# Patient Record
Sex: Female | Born: 1935 | Race: White | Hispanic: No | Marital: Married | Smoking: Never smoker
Health system: Southern US, Community
[De-identification: ages and names within clinical notes are randomized; demographics above are authoritative.]

## PROBLEM LIST (undated history)

## (undated) DIAGNOSIS — I639 Cerebral infarction, unspecified: Secondary | ICD-10-CM

## (undated) DIAGNOSIS — I2510 Atherosclerotic heart disease of native coronary artery without angina pectoris: Secondary | ICD-10-CM

## (undated) DIAGNOSIS — M1990 Unspecified osteoarthritis, unspecified site: Secondary | ICD-10-CM

## (undated) DIAGNOSIS — I350 Nonrheumatic aortic (valve) stenosis: Secondary | ICD-10-CM

## (undated) DIAGNOSIS — I739 Peripheral vascular disease, unspecified: Secondary | ICD-10-CM

## (undated) DIAGNOSIS — E785 Hyperlipidemia, unspecified: Secondary | ICD-10-CM

## (undated) DIAGNOSIS — I779 Disorder of arteries and arterioles, unspecified: Secondary | ICD-10-CM

## (undated) DIAGNOSIS — I10 Essential (primary) hypertension: Secondary | ICD-10-CM

## (undated) HISTORY — DX: Disorder of arteries and arterioles, unspecified: I77.9

## (undated) HISTORY — DX: Unspecified osteoarthritis, unspecified site: M19.90

## (undated) HISTORY — DX: Peripheral vascular disease, unspecified: I73.9

## (undated) HISTORY — DX: Cerebral infarction, unspecified: I63.9

## (undated) HISTORY — DX: Hyperlipidemia, unspecified: E78.5

## (undated) HISTORY — DX: Nonrheumatic aortic (valve) stenosis: I35.0

## (undated) HISTORY — DX: Atherosclerotic heart disease of native coronary artery without angina pectoris: I25.10

## (undated) HISTORY — DX: Essential (primary) hypertension: I10

---

## 1972-05-14 HISTORY — PX: ABDOMINAL HYSTERECTOMY: SHX81

## 2001-03-14 ENCOUNTER — Encounter: Payer: Self-pay | Admitting: Internal Medicine

## 2001-03-14 ENCOUNTER — Ambulatory Visit (HOSPITAL_COMMUNITY): Admission: RE | Admit: 2001-03-14 | Discharge: 2001-03-14 | Payer: Self-pay | Admitting: Internal Medicine

## 2001-12-06 ENCOUNTER — Inpatient Hospital Stay (HOSPITAL_COMMUNITY): Admission: EM | Admit: 2001-12-06 | Discharge: 2001-12-10 | Payer: Self-pay | Admitting: Psychiatry

## 2001-12-06 ENCOUNTER — Encounter: Payer: Self-pay | Admitting: Emergency Medicine

## 2006-05-20 ENCOUNTER — Ambulatory Visit (HOSPITAL_COMMUNITY): Admission: RE | Admit: 2006-05-20 | Discharge: 2006-05-20 | Payer: Self-pay | Admitting: Internal Medicine

## 2006-07-05 ENCOUNTER — Ambulatory Visit: Payer: Self-pay | Admitting: Vascular Surgery

## 2006-12-25 ENCOUNTER — Ambulatory Visit: Payer: Self-pay | Admitting: Vascular Surgery

## 2007-03-04 ENCOUNTER — Ambulatory Visit (HOSPITAL_COMMUNITY): Admission: RE | Admit: 2007-03-04 | Discharge: 2007-03-04 | Payer: Self-pay | Admitting: Internal Medicine

## 2008-10-04 ENCOUNTER — Encounter: Payer: Self-pay | Admitting: Cardiology

## 2008-10-05 ENCOUNTER — Inpatient Hospital Stay (HOSPITAL_COMMUNITY): Admission: RE | Admit: 2008-10-05 | Discharge: 2008-10-19 | Payer: Self-pay | Admitting: Cardiology

## 2008-10-05 ENCOUNTER — Ambulatory Visit: Payer: Self-pay | Admitting: Pulmonary Disease

## 2008-10-05 ENCOUNTER — Ambulatory Visit: Payer: Self-pay | Admitting: Cardiothoracic Surgery

## 2008-10-06 ENCOUNTER — Encounter (INDEPENDENT_AMBULATORY_CARE_PROVIDER_SITE_OTHER): Payer: Self-pay | Admitting: Cardiology

## 2008-10-06 ENCOUNTER — Encounter: Payer: Self-pay | Admitting: Cardiothoracic Surgery

## 2008-10-07 HISTORY — PX: CORONARY ARTERY BYPASS GRAFT: SHX141

## 2008-10-10 ENCOUNTER — Encounter (INDEPENDENT_AMBULATORY_CARE_PROVIDER_SITE_OTHER): Payer: Self-pay | Admitting: Cardiology

## 2008-11-12 ENCOUNTER — Encounter: Admission: RE | Admit: 2008-11-12 | Discharge: 2008-11-12 | Payer: Self-pay | Admitting: Cardiothoracic Surgery

## 2008-11-12 ENCOUNTER — Ambulatory Visit: Payer: Self-pay | Admitting: Cardiothoracic Surgery

## 2008-12-17 ENCOUNTER — Ambulatory Visit (HOSPITAL_COMMUNITY): Admission: RE | Admit: 2008-12-17 | Discharge: 2008-12-17 | Payer: Self-pay | Admitting: Cardiovascular Disease

## 2009-02-04 ENCOUNTER — Emergency Department (HOSPITAL_COMMUNITY): Admission: EM | Admit: 2009-02-04 | Discharge: 2009-02-04 | Payer: Self-pay | Admitting: Emergency Medicine

## 2009-02-08 ENCOUNTER — Ambulatory Visit (HOSPITAL_COMMUNITY): Admission: RE | Admit: 2009-02-08 | Discharge: 2009-02-08 | Payer: Self-pay | Admitting: Internal Medicine

## 2009-02-09 ENCOUNTER — Ambulatory Visit: Payer: Self-pay | Admitting: Vascular Surgery

## 2009-02-25 ENCOUNTER — Ambulatory Visit (HOSPITAL_COMMUNITY): Admission: RE | Admit: 2009-02-25 | Discharge: 2009-02-25 | Payer: Self-pay | Admitting: Vascular Surgery

## 2009-02-25 ENCOUNTER — Ambulatory Visit: Payer: Self-pay | Admitting: Vascular Surgery

## 2009-03-30 ENCOUNTER — Ambulatory Visit: Payer: Self-pay | Admitting: Vascular Surgery

## 2009-04-12 ENCOUNTER — Ambulatory Visit (HOSPITAL_COMMUNITY): Admission: RE | Admit: 2009-04-12 | Discharge: 2009-04-12 | Payer: Self-pay | Admitting: Cardiovascular Disease

## 2010-06-15 ENCOUNTER — Other Ambulatory Visit (INDEPENDENT_AMBULATORY_CARE_PROVIDER_SITE_OTHER): Payer: Medicare Other

## 2010-06-15 ENCOUNTER — Ambulatory Visit (INDEPENDENT_AMBULATORY_CARE_PROVIDER_SITE_OTHER): Payer: Medicare Other | Admitting: Vascular Surgery

## 2010-06-15 ENCOUNTER — Ambulatory Visit: Admit: 2010-06-15 | Payer: Self-pay | Admitting: Vascular Surgery

## 2010-06-15 DIAGNOSIS — I6529 Occlusion and stenosis of unspecified carotid artery: Secondary | ICD-10-CM

## 2010-06-26 NOTE — Assessment & Plan Note (Signed)
OFFICE VISIT  [name], [name] DOB:  [birth year]                                       06/15/2010 ZOXWR#:[id number]  The patient returns for follow-up today.  She was last seen in November 2010. At that time we were evaluating her for a carotid stenosis.  She had had some events of numbness and tingling in her right hand which sounded like a left brain event but she had essentially no left carotid stenosis.  Since that time she denies any new symptoms of TIA, amaurosis or stroke.  She states that the right hand numbness has been persistent and is off and on.  She says also sometimes it takes her time to collect her thoughts but she does not really think she has had any decline in her memory.  REVIEW OF SYSTEMS:  She denies any shortness of breath or chest pain.  PHYSICAL EXAMINATION:  Blood pressure is 161/79 in the right arm and 127/71 in the left arm.  Oxygen saturation is 97% on room air.  Heart rate is 63 regular.  HEENT is unremarkable.  Neck has 2+ carotid pulses without bruit.  Chest is clear to auscultation.  Cardiac exam is regular rate and rhythm without murmur.  Neurologic exam shows symmetric upper extremity and lower extremity strength which is 5/5 bilaterally. Peripheral vascular exam shows 2+ radial pulses bilaterally.  She had a carotid duplex exam today which shows a 60% to 80% stenosis on the right internal carotid artery, which is unchanged from her last carotid duplex dated September 2010.  She had no significant left carotid stenosis.  The right vertebral artery was not visualized.  She has a dominant left vertebral artery.  In summary, the patient is asymptomatic from her carotid stenosis at this point.  She will continue to take her aspirin and Plavix, and her Plavix prescription was renewed today.  She will follow up with a repeat carotid duplex scan in 6 months.    Janetta Hora. Fields, MD Electronically Signed  CEF/MEDQ   D:  06/15/2010  T:  06/16/2010  Job:  [id number]  cc:   Kingsley Callander. Ouida Sills, MD

## 2010-07-31 NOTE — Procedures (Unsigned)
CAROTID DUPLEX EXAM  INDICATION:  Follow up carotid artery disease.  HISTORY: Diabetes:  No. Cardiac:  CABG x3 and MI. Hypertension:  Yes. Smoking:  No. Previous Surgery:  No. CV History:  The patient is currently asymptomatic. Amaurosis Fugax No, Paresthesias No, Hemiparesis No                                      RIGHT             LEFT Brachial systolic pressure:         142               146 Brachial Doppler waveforms:         WNL               WNL Vertebral direction of flow:        Not visualized.   Antegrade. DUPLEX VELOCITIES (cm/sec) CCA peak systolic                   61                73 ECA peak systolic                   178               66 ICA peak systolic                   229               89 ICA end diastolic                   45                25 PLAQUE MORPHOLOGY:                  Heterogeneous     Heterogeneous PLAQUE AMOUNT:                      Moderate          Moderate PLAQUE LOCATION:                    ICA and the ECA   ICA and the ECA  IMPRESSION:  Patient with 60% to 79% stenosis within the right internal carotid artery.  Right vertebral not visualized although the left vertebral is prominent.  Intimal thickening within the common carotid arteries.  Bilateral tortuous vessels.  Right external carotid artery stenosis.  Study stable when compared to previous.  ___________________________________________ Janetta Hora Darrick Penna, MD  OD/MEDQ  D:  06/15/2010  T:  06/15/2010  Job:  [id number]

## 2010-08-17 LAB — GLUCOSE, CAPILLARY: Glucose-Capillary: 86 mg/dL (ref 70–99)

## 2010-08-17 LAB — POCT I-STAT, CHEM 8
BUN: 15 mg/dL (ref 6–23)
Creatinine, Ser: 0.7 mg/dL (ref 0.4–1.2)
Potassium: 3.8 mEq/L (ref 3.5–5.1)
Sodium: 141 mEq/L (ref 135–145)
TCO2: 31 mmol/L (ref 0–100)

## 2010-08-18 LAB — DIFFERENTIAL
Basophils Absolute: 0.1 10*3/uL (ref 0.0–0.1)
Eosinophils Relative: 2 % (ref 0–5)
Lymphocytes Relative: 25 % (ref 12–46)
Lymphs Abs: 1.6 10*3/uL (ref 0.7–4.0)
Monocytes Absolute: 0.5 10*3/uL (ref 0.1–1.0)
Monocytes Relative: 8 % (ref 3–12)
Neutro Abs: 4 10*3/uL (ref 1.7–7.7)

## 2010-08-18 LAB — CBC
HCT: 34.3 % — ABNORMAL LOW (ref 36.0–46.0)
Hemoglobin: 11.4 g/dL — ABNORMAL LOW (ref 12.0–15.0)
RBC: 3.57 MIL/uL — ABNORMAL LOW (ref 3.87–5.11)
WBC: 6.3 10*3/uL (ref 4.0–10.5)

## 2010-08-18 LAB — BASIC METABOLIC PANEL
Calcium: 9.6 mg/dL (ref 8.4–10.5)
GFR calc Af Amer: >60 mL/min (ref >60)
GFR calc non Af Amer: 53 mL/min — ABNORMAL LOW (ref >60)
Glucose, Bld: 89 mg/dL (ref 70–99)
Potassium: 3.1 mEq/L — ABNORMAL LOW (ref 3.5–5.1)
Sodium: 141 mEq/L (ref 135–145)

## 2010-08-18 LAB — POCT CARDIAC MARKERS
CKMB, poc: <1 ng/mL — ABNORMAL LOW (ref 1.0–8.0)
Myoglobin, poc: 60.7 ng/mL (ref 12–200)

## 2010-08-21 LAB — POCT I-STAT, CHEM 8
BUN: 21 mg/dL (ref 6–23)
BUN: 22 mg/dL (ref 6–23)
Calcium, Ion: 1.06 mmol/L — ABNORMAL LOW (ref 1.12–1.32)
Chloride: 103 mEq/L (ref 96–112)
Creatinine, Ser: 1.2 mg/dL (ref 0.4–1.2)
Creatinine, Ser: 1.3 mg/dL — ABNORMAL HIGH (ref 0.4–1.2)
Glucose, Bld: 95 mg/dL (ref 70–99)
Sodium: 136 mEq/L (ref 135–145)
TCO2: 24 mmol/L (ref 0–100)

## 2010-08-21 LAB — GLUCOSE, CAPILLARY
Glucose-Capillary: 110 mg/dL — ABNORMAL HIGH (ref 70–99)
Glucose-Capillary: 112 mg/dL — ABNORMAL HIGH (ref 70–99)
Glucose-Capillary: 113 mg/dL — ABNORMAL HIGH (ref 70–99)
Glucose-Capillary: 113 mg/dL — ABNORMAL HIGH (ref 70–99)
Glucose-Capillary: 114 mg/dL — ABNORMAL HIGH (ref 70–99)
Glucose-Capillary: 116 mg/dL — ABNORMAL HIGH (ref 70–99)
Glucose-Capillary: 116 mg/dL — ABNORMAL HIGH (ref 70–99)
Glucose-Capillary: 121 mg/dL — ABNORMAL HIGH (ref 70–99)
Glucose-Capillary: 122 mg/dL — ABNORMAL HIGH (ref 70–99)
Glucose-Capillary: 164 mg/dL — ABNORMAL HIGH (ref 70–99)
Glucose-Capillary: 166 mg/dL — ABNORMAL HIGH (ref 70–99)
Glucose-Capillary: 174 mg/dL — ABNORMAL HIGH (ref 70–99)
Glucose-Capillary: 177 mg/dL — ABNORMAL HIGH (ref 70–99)
Glucose-Capillary: 178 mg/dL — ABNORMAL HIGH (ref 70–99)
Glucose-Capillary: 207 mg/dL — ABNORMAL HIGH (ref 70–99)
Glucose-Capillary: 233 mg/dL — ABNORMAL HIGH (ref 70–99)
Glucose-Capillary: 307 mg/dL — ABNORMAL HIGH (ref 70–99)
Glucose-Capillary: 88 mg/dL (ref 70–99)
Glucose-Capillary: 93 mg/dL (ref 70–99)
Glucose-Capillary: 98 mg/dL (ref 70–99)

## 2010-08-21 LAB — COMPREHENSIVE METABOLIC PANEL
AST: 131 U/L — ABNORMAL HIGH (ref 0–37)
CO2: 27 mEq/L (ref 19–32)
Calcium: 7.4 mg/dL — ABNORMAL LOW (ref 8.4–10.5)
Creatinine, Ser: 1.15 mg/dL (ref 0.4–1.2)
GFR calc Af Amer: 55 mL/min — ABNORMAL LOW (ref >60)
GFR calc non Af Amer: 46 mL/min — ABNORMAL LOW (ref >60)
Glucose, Bld: 200 mg/dL — ABNORMAL HIGH (ref 70–99)
Total Protein: 4.7 g/dL — ABNORMAL LOW (ref 6.0–8.3)

## 2010-08-21 LAB — CULTURE, RESPIRATORY W GRAM STAIN: Culture: NORMAL

## 2010-08-21 LAB — CBC
HCT: 26.9 % — ABNORMAL LOW (ref 36.0–46.0)
HCT: 27.9 % — ABNORMAL LOW (ref 36.0–46.0)
HCT: 28.1 % — ABNORMAL LOW (ref 36.0–46.0)
HCT: 28.5 % — ABNORMAL LOW (ref 36.0–46.0)
Hemoglobin: 9.1 g/dL — ABNORMAL LOW (ref 12.0–15.0)
Hemoglobin: 9.1 g/dL — ABNORMAL LOW (ref 12.0–15.0)
Hemoglobin: 9.4 g/dL — ABNORMAL LOW (ref 12.0–15.0)
Hemoglobin: 9.5 g/dL — ABNORMAL LOW (ref 12.0–15.0)
Hemoglobin: 9.8 g/dL — ABNORMAL LOW (ref 12.0–15.0)
MCHC: 33.8 g/dL (ref 30.0–36.0)
MCHC: 33.8 g/dL (ref 30.0–36.0)
MCHC: 34 g/dL (ref 30.0–36.0)
MCHC: 34.2 g/dL (ref 30.0–36.0)
MCHC: 34.4 g/dL (ref 30.0–36.0)
MCV: 94.1 fL (ref 78.0–100.0)
MCV: 94.1 fL (ref 78.0–100.0)
MCV: 94.5 fL (ref 78.0–100.0)
MCV: 94.7 fL (ref 78.0–100.0)
Platelets: 148 10*3/uL — ABNORMAL LOW (ref 150–400)
Platelets: 166 10*3/uL (ref 150–400)
Platelets: 216 10*3/uL (ref 150–400)
Platelets: 235 10*3/uL (ref 150–400)
RBC: 2.82 MIL/uL — ABNORMAL LOW (ref 3.87–5.11)
RBC: 2.86 MIL/uL — ABNORMAL LOW (ref 3.87–5.11)
RBC: 2.96 MIL/uL — ABNORMAL LOW (ref 3.87–5.11)
RBC: 2.97 MIL/uL — ABNORMAL LOW (ref 3.87–5.11)
RBC: 3.01 MIL/uL — ABNORMAL LOW (ref 3.87–5.11)
RBC: 3.66 MIL/uL — ABNORMAL LOW (ref 3.87–5.11)
RDW: 14.7 % (ref 11.5–15.5)
RDW: 14.8 % (ref 11.5–15.5)
RDW: 14.9 % (ref 11.5–15.5)
RDW: 14.9 % (ref 11.5–15.5)
RDW: 15 % (ref 11.5–15.5)
WBC: 5.9 10*3/uL (ref 4.0–10.5)
WBC: 8.1 10*3/uL (ref 4.0–10.5)
WBC: 8.5 10*3/uL (ref 4.0–10.5)
WBC: 8.8 10*3/uL (ref 4.0–10.5)
WBC: 9.2 10*3/uL (ref 4.0–10.5)

## 2010-08-21 LAB — BASIC METABOLIC PANEL
BUN: 17 mg/dL (ref 6–23)
BUN: 18 mg/dL (ref 6–23)
BUN: 21 mg/dL (ref 6–23)
BUN: 24 mg/dL — ABNORMAL HIGH (ref 6–23)
BUN: 26 mg/dL — ABNORMAL HIGH (ref 6–23)
CO2: 26 mEq/L (ref 19–32)
CO2: 26 mEq/L (ref 19–32)
CO2: 28 mEq/L (ref 19–32)
CO2: 28 mEq/L (ref 19–32)
CO2: 29 mEq/L (ref 19–32)
CO2: 30 mEq/L (ref 19–32)
Calcium: 7.7 mg/dL — ABNORMAL LOW (ref 8.4–10.5)
Calcium: 8 mg/dL — ABNORMAL LOW (ref 8.4–10.5)
Calcium: 8.2 mg/dL — ABNORMAL LOW (ref 8.4–10.5)
Calcium: 8.4 mg/dL (ref 8.4–10.5)
Calcium: 8.8 mg/dL (ref 8.4–10.5)
Calcium: 9.1 mg/dL (ref 8.4–10.5)
Chloride: 101 mEq/L (ref 96–112)
Chloride: 103 mEq/L (ref 96–112)
Chloride: 103 mEq/L (ref 96–112)
Chloride: 106 mEq/L (ref 96–112)
Chloride: 97 mEq/L (ref 96–112)
Creatinine, Ser: 1 mg/dL (ref 0.4–1.2)
Creatinine, Ser: 1.03 mg/dL (ref 0.4–1.2)
Creatinine, Ser: 1.03 mg/dL (ref 0.4–1.2)
Creatinine, Ser: 1.07 mg/dL (ref 0.4–1.2)
Creatinine, Ser: 1.08 mg/dL (ref 0.4–1.2)
Creatinine, Ser: 1.31 mg/dL — ABNORMAL HIGH (ref 0.4–1.2)
GFR calc Af Amer: 47 mL/min — ABNORMAL LOW (ref >60)
GFR calc Af Amer: 59 mL/min — ABNORMAL LOW (ref >60)
GFR calc Af Amer: 60 mL/min — ABNORMAL LOW (ref >60)
GFR calc Af Amer: >60 mL/min (ref >60)
GFR calc Af Amer: >60 mL/min (ref >60)
GFR calc Af Amer: >60 mL/min (ref >60)
GFR calc non Af Amer: 39 mL/min — ABNORMAL LOW (ref >60)
GFR calc non Af Amer: 49 mL/min — ABNORMAL LOW (ref >60)
GFR calc non Af Amer: 49 mL/min — ABNORMAL LOW (ref >60)
GFR calc non Af Amer: 52 mL/min — ABNORMAL LOW (ref >60)
GFR calc non Af Amer: 52 mL/min — ABNORMAL LOW (ref >60)
GFR calc non Af Amer: 53 mL/min — ABNORMAL LOW (ref >60)
Glucose, Bld: 114 mg/dL — ABNORMAL HIGH (ref 70–99)
Glucose, Bld: 226 mg/dL — ABNORMAL HIGH (ref 70–99)
Glucose, Bld: 264 mg/dL — ABNORMAL HIGH (ref 70–99)
Glucose, Bld: 98 mg/dL (ref 70–99)
Glucose, Bld: 99 mg/dL (ref 70–99)
Potassium: 3.9 mEq/L (ref 3.5–5.1)
Potassium: 3.9 mEq/L (ref 3.5–5.1)
Potassium: 4 mEq/L (ref 3.5–5.1)
Potassium: 4.1 mEq/L (ref 3.5–5.1)
Potassium: 4.6 mEq/L (ref 3.5–5.1)
Sodium: 130 mEq/L — ABNORMAL LOW (ref 135–145)
Sodium: 137 mEq/L (ref 135–145)
Sodium: 137 mEq/L (ref 135–145)
Sodium: 139 mEq/L (ref 135–145)
Sodium: 140 mEq/L (ref 135–145)

## 2010-08-21 LAB — POCT I-STAT 3, ART BLOOD GAS (G3+)
Bicarbonate: 23.7 mEq/L (ref 20.0–24.0)
Bicarbonate: 25 mEq/L — ABNORMAL HIGH (ref 20.0–24.0)
Patient temperature: 37
Patient temperature: 37.4
TCO2: 25 mmol/L (ref 0–100)
TCO2: 26 mmol/L (ref 0–100)
pCO2 arterial: 34.4 mmHg — ABNORMAL LOW (ref 35.0–45.0)
pCO2 arterial: 35.2 mmHg (ref 35.0–45.0)
pH, Arterial: 7.475 — ABNORMAL HIGH (ref 7.350–7.400)
pO2, Arterial: 79 mmHg — ABNORMAL LOW (ref 80.0–100.0)
pO2, Arterial: 79 mmHg — ABNORMAL LOW (ref 80.0–100.0)

## 2010-08-21 LAB — MAGNESIUM: Magnesium: 2.1 mg/dL (ref 1.5–2.5)

## 2010-08-21 LAB — BRAIN NATRIURETIC PEPTIDE
Pro B Natriuretic peptide (BNP): 1145 pg/mL — ABNORMAL HIGH (ref 0.0–100.0)
Pro B Natriuretic peptide (BNP): 1245 pg/mL — ABNORMAL HIGH (ref 0.0–100.0)
Pro B Natriuretic peptide (BNP): 1666 pg/mL — ABNORMAL HIGH (ref 0.0–100.0)
Pro B Natriuretic peptide (BNP): 796 pg/mL — ABNORMAL HIGH (ref 0.0–100.0)

## 2010-08-21 LAB — TYPE AND SCREEN
ABO/RH(D): A POS
Antibody Screen: NEGATIVE

## 2010-08-21 LAB — TSH: TSH: 1.543 u[IU]/mL (ref 0.350–4.500)

## 2010-08-22 LAB — BASIC METABOLIC PANEL
BUN: 11 mg/dL (ref 6–23)
BUN: 13 mg/dL (ref 6–23)
BUN: 14 mg/dL (ref 6–23)
BUN: 27 mg/dL — ABNORMAL HIGH (ref 6–23)
BUN: 27 mg/dL — ABNORMAL HIGH (ref 6–23)
CO2: 26 mEq/L (ref 19–32)
CO2: 26 mEq/L (ref 19–32)
CO2: 29 mEq/L (ref 19–32)
CO2: 29 mEq/L (ref 19–32)
Calcium: 7 mg/dL — ABNORMAL LOW (ref 8.4–10.5)
Calcium: 8.5 mg/dL (ref 8.4–10.5)
Calcium: 8.8 mg/dL (ref 8.4–10.5)
Calcium: 8.9 mg/dL (ref 8.4–10.5)
Calcium: 9.1 mg/dL (ref 8.4–10.5)
Calcium: 9.4 mg/dL (ref 8.4–10.5)
Chloride: 103 mEq/L (ref 96–112)
Chloride: 106 mEq/L (ref 96–112)
Chloride: 107 mEq/L (ref 96–112)
Chloride: 111 mEq/L (ref 96–112)
Creatinine, Ser: 0.81 mg/dL (ref 0.4–1.2)
Creatinine, Ser: 0.91 mg/dL (ref 0.4–1.2)
Creatinine, Ser: 0.93 mg/dL (ref 0.4–1.2)
Creatinine, Ser: 0.96 mg/dL (ref 0.4–1.2)
Creatinine, Ser: 1.09 mg/dL (ref 0.4–1.2)
GFR calc Af Amer: >60 mL/min (ref >60)
GFR calc Af Amer: >60 mL/min (ref >60)
GFR calc Af Amer: >60 mL/min (ref >60)
GFR calc Af Amer: >60 mL/min (ref >60)
GFR calc non Af Amer: 44 mL/min — ABNORMAL LOW (ref >60)
GFR calc non Af Amer: 48 mL/min — ABNORMAL LOW (ref >60)
GFR calc non Af Amer: 53 mL/min — ABNORMAL LOW (ref >60)
GFR calc non Af Amer: 53 mL/min — ABNORMAL LOW (ref >60)
GFR calc non Af Amer: 56 mL/min — ABNORMAL LOW (ref >60)
GFR calc non Af Amer: 60 mL/min — ABNORMAL LOW (ref >60)
GFR calc non Af Amer: >60 mL/min (ref >60)
Glucose, Bld: 109 mg/dL — ABNORMAL HIGH (ref 70–99)
Glucose, Bld: 114 mg/dL — ABNORMAL HIGH (ref 70–99)
Glucose, Bld: 115 mg/dL — ABNORMAL HIGH (ref 70–99)
Glucose, Bld: 143 mg/dL — ABNORMAL HIGH (ref 70–99)
Glucose, Bld: 158 mg/dL — ABNORMAL HIGH (ref 70–99)
Glucose, Bld: 175 mg/dL — ABNORMAL HIGH (ref 70–99)
Glucose, Bld: 193 mg/dL — ABNORMAL HIGH (ref 70–99)
Potassium: 3.7 mEq/L (ref 3.5–5.1)
Potassium: 3.9 mEq/L (ref 3.5–5.1)
Potassium: 4 mEq/L (ref 3.5–5.1)
Potassium: 4.3 mEq/L (ref 3.5–5.1)
Potassium: 6.4 mEq/L (ref 3.5–5.1)
Sodium: 138 mEq/L (ref 135–145)
Sodium: 139 mEq/L (ref 135–145)
Sodium: 139 mEq/L (ref 135–145)
Sodium: 139 mEq/L (ref 135–145)
Sodium: 139 mEq/L (ref 135–145)
Sodium: 141 mEq/L (ref 135–145)
Sodium: 142 mEq/L (ref 135–145)

## 2010-08-22 LAB — POCT I-STAT 3, ART BLOOD GAS (G3+)
Acid-Base Excess: 1 mmol/L (ref 0.0–2.0)
Acid-Base Excess: 1 mmol/L (ref 0.0–2.0)
Acid-Base Excess: 2 mmol/L (ref 0.0–2.0)
Acid-base deficit: 1 mmol/L (ref 0.0–2.0)
Acid-base deficit: 1 mmol/L (ref 0.0–2.0)
Acid-base deficit: 2 mmol/L (ref 0.0–2.0)
Acid-base deficit: 7 mmol/L — ABNORMAL HIGH (ref 0.0–2.0)
Bicarbonate: 15.2 mEq/L — ABNORMAL LOW (ref 20.0–24.0)
Bicarbonate: 16.5 mEq/L — ABNORMAL LOW (ref 20.0–24.0)
Bicarbonate: 17.1 mEq/L — ABNORMAL LOW (ref 20.0–24.0)
Bicarbonate: 17.2 mEq/L — ABNORMAL LOW (ref 20.0–24.0)
Bicarbonate: 19.9 mEq/L — ABNORMAL LOW (ref 20.0–24.0)
Bicarbonate: 21.4 mEq/L (ref 20.0–24.0)
Bicarbonate: 22.6 mEq/L (ref 20.0–24.0)
Bicarbonate: 23.1 mEq/L (ref 20.0–24.0)
Bicarbonate: 23.1 mEq/L (ref 20.0–24.0)
Bicarbonate: 24.1 mEq/L — ABNORMAL HIGH (ref 20.0–24.0)
Bicarbonate: 25 mEq/L — ABNORMAL HIGH (ref 20.0–24.0)
Bicarbonate: 25.3 mEq/L — ABNORMAL HIGH (ref 20.0–24.0)
Bicarbonate: 25.3 mEq/L — ABNORMAL HIGH (ref 20.0–24.0)
Bicarbonate: 25.5 mEq/L — ABNORMAL HIGH (ref 20.0–24.0)
Bicarbonate: 26.5 mEq/L — ABNORMAL HIGH (ref 20.0–24.0)
O2 Saturation: 100 %
O2 Saturation: 100 %
O2 Saturation: 100 %
O2 Saturation: 60 %
O2 Saturation: 85 %
O2 Saturation: 93 %
O2 Saturation: 94 %
O2 Saturation: 94 %
O2 Saturation: 96 %
O2 Saturation: 97 %
Patient temperature: 36.1
Patient temperature: 36.5
Patient temperature: 37.1
Patient temperature: 37.9
Patient temperature: 38.3
Patient temperature: 38.8
Patient temperature: 97.6
Patient temperature: 98.6
TCO2: 16 mmol/L (ref 0–100)
TCO2: 17 mmol/L (ref 0–100)
TCO2: 18 mmol/L (ref 0–100)
TCO2: 21 mmol/L (ref 0–100)
TCO2: 23 mmol/L (ref 0–100)
TCO2: 24 mmol/L (ref 0–100)
TCO2: 24 mmol/L (ref 0–100)
TCO2: 25 mmol/L (ref 0–100)
TCO2: 25 mmol/L (ref 0–100)
TCO2: 26 mmol/L (ref 0–100)
TCO2: 27 mmol/L (ref 0–100)
TCO2: 27 mmol/L (ref 0–100)
pCO2 arterial: 24.4 mmHg — ABNORMAL LOW (ref 35.0–45.0)
pCO2 arterial: 27 mmHg — ABNORMAL LOW (ref 35.0–45.0)
pCO2 arterial: 28.5 mmHg — ABNORMAL LOW (ref 35.0–45.0)
pCO2 arterial: 28.7 mmHg — ABNORMAL LOW (ref 35.0–45.0)
pCO2 arterial: 29.3 mmHg — ABNORMAL LOW (ref 35.0–45.0)
pCO2 arterial: 29.6 mmHg — ABNORMAL LOW (ref 35.0–45.0)
pCO2 arterial: 29.7 mmHg — ABNORMAL LOW (ref 35.0–45.0)
pCO2 arterial: 30.1 mmHg — ABNORMAL LOW (ref 35.0–45.0)
pCO2 arterial: 32.5 mmHg — ABNORMAL LOW (ref 35.0–45.0)
pCO2 arterial: 34.2 mmHg — ABNORMAL LOW (ref 35.0–45.0)
pCO2 arterial: 40.5 mmHg (ref 35.0–45.0)
pCO2 arterial: 40.5 mmHg (ref 35.0–45.0)
pCO2 arterial: 43.8 mmHg (ref 35.0–45.0)
pH, Arterial: 7.31 — ABNORMAL LOW (ref 7.350–7.400)
pH, Arterial: 7.37 (ref 7.350–7.400)
pH, Arterial: 7.397 (ref 7.350–7.400)
pH, Arterial: 7.407 — ABNORMAL HIGH (ref 7.350–7.400)
pH, Arterial: 7.439 — ABNORMAL HIGH (ref 7.350–7.400)
pH, Arterial: 7.453 — ABNORMAL HIGH (ref 7.350–7.400)
pH, Arterial: 7.481 — ABNORMAL HIGH (ref 7.350–7.400)
pH, Arterial: 7.493 — ABNORMAL HIGH (ref 7.350–7.400)
pH, Arterial: 7.497 — ABNORMAL HIGH (ref 7.350–7.400)
pH, Arterial: 7.519 — ABNORMAL HIGH (ref 7.350–7.400)
pO2, Arterial: 263 mmHg — ABNORMAL HIGH (ref 80.0–100.0)
pO2, Arterial: 316 mmHg — ABNORMAL HIGH (ref 80.0–100.0)
pO2, Arterial: 332 mmHg — ABNORMAL HIGH (ref 80.0–100.0)
pO2, Arterial: 47 mmHg — ABNORMAL LOW (ref 80.0–100.0)
pO2, Arterial: 66 mmHg — ABNORMAL LOW (ref 80.0–100.0)
pO2, Arterial: 68 mmHg — ABNORMAL LOW (ref 80.0–100.0)
pO2, Arterial: 93 mmHg (ref 80.0–100.0)

## 2010-08-22 LAB — GLUCOSE, CAPILLARY
Glucose-Capillary: 105 mg/dL — ABNORMAL HIGH (ref 70–99)
Glucose-Capillary: 110 mg/dL — ABNORMAL HIGH (ref 70–99)
Glucose-Capillary: 111 mg/dL — ABNORMAL HIGH (ref 70–99)
Glucose-Capillary: 117 mg/dL — ABNORMAL HIGH (ref 70–99)
Glucose-Capillary: 123 mg/dL — ABNORMAL HIGH (ref 70–99)
Glucose-Capillary: 124 mg/dL — ABNORMAL HIGH (ref 70–99)
Glucose-Capillary: 131 mg/dL — ABNORMAL HIGH (ref 70–99)
Glucose-Capillary: 135 mg/dL — ABNORMAL HIGH (ref 70–99)
Glucose-Capillary: 136 mg/dL — ABNORMAL HIGH (ref 70–99)
Glucose-Capillary: 136 mg/dL — ABNORMAL HIGH (ref 70–99)
Glucose-Capillary: 140 mg/dL — ABNORMAL HIGH (ref 70–99)
Glucose-Capillary: 142 mg/dL — ABNORMAL HIGH (ref 70–99)
Glucose-Capillary: 146 mg/dL — ABNORMAL HIGH (ref 70–99)
Glucose-Capillary: 153 mg/dL — ABNORMAL HIGH (ref 70–99)
Glucose-Capillary: 158 mg/dL — ABNORMAL HIGH (ref 70–99)
Glucose-Capillary: 180 mg/dL — ABNORMAL HIGH (ref 70–99)
Glucose-Capillary: 70 mg/dL (ref 70–99)
Glucose-Capillary: 73 mg/dL (ref 70–99)
Glucose-Capillary: 75 mg/dL (ref 70–99)

## 2010-08-22 LAB — POCT I-STAT 4, (NA,K, GLUC, HGB,HCT)
Glucose, Bld: 112 mg/dL — ABNORMAL HIGH (ref 70–99)
Glucose, Bld: 113 mg/dL — ABNORMAL HIGH (ref 70–99)
Glucose, Bld: 177 mg/dL — ABNORMAL HIGH (ref 70–99)
Glucose, Bld: 75 mg/dL (ref 70–99)
HCT: 25 % — ABNORMAL LOW (ref 36.0–46.0)
Hemoglobin: 10.2 g/dL — ABNORMAL LOW (ref 12.0–15.0)
Hemoglobin: 13.6 g/dL (ref 12.0–15.0)
Hemoglobin: 6.8 g/dL — CL (ref 12.0–15.0)
Hemoglobin: 8.5 g/dL — ABNORMAL LOW (ref 12.0–15.0)
Hemoglobin: 8.5 g/dL — ABNORMAL LOW (ref 12.0–15.0)
Hemoglobin: 9.2 g/dL — ABNORMAL LOW (ref 12.0–15.0)
Potassium: 3.3 mEq/L — ABNORMAL LOW (ref 3.5–5.1)
Potassium: 3.9 mEq/L (ref 3.5–5.1)
Potassium: 4.1 mEq/L (ref 3.5–5.1)
Potassium: 4.6 mEq/L (ref 3.5–5.1)
Sodium: 131 mEq/L — ABNORMAL LOW (ref 135–145)
Sodium: 134 mEq/L — ABNORMAL LOW (ref 135–145)
Sodium: 137 mEq/L (ref 135–145)
Sodium: 139 mEq/L (ref 135–145)
Sodium: 140 mEq/L (ref 135–145)
Sodium: 141 mEq/L (ref 135–145)

## 2010-08-22 LAB — CBC
HCT: 29.6 % — ABNORMAL LOW (ref 36.0–46.0)
HCT: 32.2 % — ABNORMAL LOW (ref 36.0–46.0)
HCT: 32.6 % — ABNORMAL LOW (ref 36.0–46.0)
HCT: 33.7 % — ABNORMAL LOW (ref 36.0–46.0)
HCT: 34.2 % — ABNORMAL LOW (ref 36.0–46.0)
HCT: 34.6 % — ABNORMAL LOW (ref 36.0–46.0)
HCT: 39.2 % (ref 36.0–46.0)
Hemoglobin: 10.2 g/dL — ABNORMAL LOW (ref 12.0–15.0)
Hemoglobin: 10.5 g/dL — ABNORMAL LOW (ref 12.0–15.0)
Hemoglobin: 11 g/dL — ABNORMAL LOW (ref 12.0–15.0)
Hemoglobin: 11.1 g/dL — ABNORMAL LOW (ref 12.0–15.0)
Hemoglobin: 11.2 g/dL — ABNORMAL LOW (ref 12.0–15.0)
Hemoglobin: 11.3 g/dL — ABNORMAL LOW (ref 12.0–15.0)
Hemoglobin: 11.3 g/dL — ABNORMAL LOW (ref 12.0–15.0)
Hemoglobin: 11.6 g/dL — ABNORMAL LOW (ref 12.0–15.0)
Hemoglobin: 11.7 g/dL — ABNORMAL LOW (ref 12.0–15.0)
Hemoglobin: 13.4 g/dL (ref 12.0–15.0)
MCHC: 33.4 g/dL (ref 30.0–36.0)
MCHC: 34.1 g/dL (ref 30.0–36.0)
MCHC: 34.1 g/dL (ref 30.0–36.0)
MCHC: 34.1 g/dL (ref 30.0–36.0)
MCHC: 34.2 g/dL (ref 30.0–36.0)
MCHC: 34.3 g/dL (ref 30.0–36.0)
MCHC: 34.7 g/dL (ref 30.0–36.0)
MCV: 92.8 fL (ref 78.0–100.0)
MCV: 93.6 fL (ref 78.0–100.0)
MCV: 93.7 fL (ref 78.0–100.0)
MCV: 93.8 fL (ref 78.0–100.0)
MCV: 94.2 fL (ref 78.0–100.0)
MCV: 94.5 fL (ref 78.0–100.0)
Platelets: 114 10*3/uL — ABNORMAL LOW (ref 150–400)
Platelets: 116 10*3/uL — ABNORMAL LOW (ref 150–400)
Platelets: 121 10*3/uL — ABNORMAL LOW (ref 150–400)
Platelets: 122 10*3/uL — ABNORMAL LOW (ref 150–400)
Platelets: 127 10*3/uL — ABNORMAL LOW (ref 150–400)
Platelets: 141 10*3/uL — ABNORMAL LOW (ref 150–400)
RBC: 3.03 MIL/uL — ABNORMAL LOW (ref 3.87–5.11)
RBC: 3.4 MIL/uL — ABNORMAL LOW (ref 3.87–5.11)
RBC: 3.41 MIL/uL — ABNORMAL LOW (ref 3.87–5.11)
RBC: 3.48 MIL/uL — ABNORMAL LOW (ref 3.87–5.11)
RBC: 3.59 MIL/uL — ABNORMAL LOW (ref 3.87–5.11)
RBC: 3.63 MIL/uL — ABNORMAL LOW (ref 3.87–5.11)
RBC: 4.23 MIL/uL (ref 3.87–5.11)
RDW: 13.7 % (ref 11.5–15.5)
RDW: 13.7 % (ref 11.5–15.5)
RDW: 14.3 % (ref 11.5–15.5)
RDW: 14.4 % (ref 11.5–15.5)
RDW: 14.5 % (ref 11.5–15.5)
RDW: 14.5 % (ref 11.5–15.5)
RDW: 14.6 % (ref 11.5–15.5)
RDW: 14.8 % (ref 11.5–15.5)
RDW: 15.2 % (ref 11.5–15.5)
RDW: 15.4 % (ref 11.5–15.5)
WBC: 10.1 10*3/uL (ref 4.0–10.5)
WBC: 10.1 10*3/uL (ref 4.0–10.5)
WBC: 10.4 10*3/uL (ref 4.0–10.5)
WBC: 10.6 10*3/uL — ABNORMAL HIGH (ref 4.0–10.5)
WBC: 11.2 10*3/uL — ABNORMAL HIGH (ref 4.0–10.5)
WBC: 11.3 10*3/uL — ABNORMAL HIGH (ref 4.0–10.5)
WBC: 14 10*3/uL — ABNORMAL HIGH (ref 4.0–10.5)

## 2010-08-22 LAB — URINE MICROSCOPIC-ADD ON

## 2010-08-22 LAB — POCT I-STAT 3, VENOUS BLOOD GAS (G3P V)
Acid-base deficit: 2 mmol/L (ref 0.0–2.0)
Bicarbonate: 23.7 mEq/L (ref 20.0–24.0)
Bicarbonate: 24.7 mEq/L — ABNORMAL HIGH (ref 20.0–24.0)
O2 Saturation: 73 %
TCO2: 26 mmol/L (ref 0–100)
pCO2, Ven: 43.1 mmHg — ABNORMAL LOW (ref 45.0–50.0)
pO2, Ven: 51 mmHg — ABNORMAL HIGH (ref 30.0–45.0)

## 2010-08-22 LAB — CULTURE, BLOOD (ROUTINE X 2): Culture: NO GROWTH

## 2010-08-22 LAB — POCT I-STAT, CHEM 8
BUN: 14 mg/dL (ref 6–23)
BUN: 21 mg/dL (ref 6–23)
Calcium, Ion: 1.17 mmol/L (ref 1.12–1.32)
Calcium, Ion: 1.25 mmol/L (ref 1.12–1.32)
Chloride: 102 mEq/L (ref 96–112)
Chloride: 103 mEq/L (ref 96–112)
Chloride: 105 mEq/L (ref 96–112)
Creatinine, Ser: 0.9 mg/dL (ref 0.4–1.2)
Creatinine, Ser: 1.1 mg/dL (ref 0.4–1.2)
Creatinine, Ser: 1.2 mg/dL (ref 0.4–1.2)
Glucose, Bld: 121 mg/dL — ABNORMAL HIGH (ref 70–99)
Glucose, Bld: 136 mg/dL — ABNORMAL HIGH (ref 70–99)
HCT: 27 % — ABNORMAL LOW (ref 36.0–46.0)
HCT: 36 % (ref 36.0–46.0)
Hemoglobin: 10.5 g/dL — ABNORMAL LOW (ref 12.0–15.0)
Hemoglobin: 11.6 g/dL — ABNORMAL LOW (ref 12.0–15.0)
Potassium: 3.3 mEq/L — ABNORMAL LOW (ref 3.5–5.1)
Potassium: 3.9 mEq/L (ref 3.5–5.1)
Potassium: 4.4 mEq/L (ref 3.5–5.1)
Sodium: 136 mEq/L (ref 135–145)
TCO2: 23 mmol/L (ref 0–100)

## 2010-08-22 LAB — IRON AND TIBC
Iron: 87 ug/dL (ref 42–135)
Saturation Ratios: 54 % (ref 20–55)
TIBC: 160 ug/dL — ABNORMAL LOW (ref 250–470)
UIBC: 73 ug/dL

## 2010-08-22 LAB — CROSSMATCH
ABO/RH(D): A POS
ABO/RH(D): A POS

## 2010-08-22 LAB — HEMOGLOBIN AND HEMATOCRIT, BLOOD
HCT: 26.1 % — ABNORMAL LOW (ref 36.0–46.0)
Hemoglobin: 8.9 g/dL — ABNORMAL LOW (ref 12.0–15.0)

## 2010-08-22 LAB — COMPREHENSIVE METABOLIC PANEL
ALT: 23 U/L (ref 0–35)
Albumin: 2.8 g/dL — ABNORMAL LOW (ref 3.5–5.2)
Alkaline Phosphatase: 42 U/L (ref 39–117)
Chloride: 102 mEq/L (ref 96–112)
Glucose, Bld: 88 mg/dL (ref 70–99)
Potassium: 3.9 mEq/L (ref 3.5–5.1)
Sodium: 139 mEq/L (ref 135–145)
Total Bilirubin: 0.7 mg/dL (ref 0.3–1.2)
Total Protein: 6.5 g/dL (ref 6.0–8.3)

## 2010-08-22 LAB — TSH: TSH: 1.104 u[IU]/mL (ref 0.350–4.500)

## 2010-08-22 LAB — BLOOD GAS, ARTERIAL
FIO2: 0.21 %
pCO2 arterial: 42.5 mmHg (ref 35.0–45.0)
pH, Arterial: 7.388 (ref 7.350–7.400)

## 2010-08-22 LAB — VITAMIN B12: Vitamin B-12: 649 pg/mL (ref 211–911)

## 2010-08-22 LAB — LACTIC ACID, PLASMA
Lactic Acid, Venous: 1.6 mmol/L (ref 0.5–2.2)
Lactic Acid, Venous: 11.8 mmol/L — ABNORMAL HIGH (ref 0.5–2.2)

## 2010-08-22 LAB — CARDIAC PANEL(CRET KIN+CKTOT+MB+TROPI)
CK, MB: 176.9 ng/mL — ABNORMAL HIGH (ref 0.3–4.0)
CK, MB: 2.8 ng/mL (ref 0.3–4.0)
CK, MB: 4.3 ng/mL — ABNORMAL HIGH (ref 0.3–4.0)
Relative Index: 22.1 — ABNORMAL HIGH (ref 0.0–2.5)
Relative Index: INVALID (ref 0.0–2.5)
Relative Index: INVALID (ref 0.0–2.5)
Total CK: 111 U/L (ref 7–177)
Total CK: 99 U/L (ref 7–177)
Troponin I: 3.45 ng/mL (ref 0.00–0.06)
Troponin I: 3.97 ng/mL (ref 0.00–0.06)
Troponin I: 4.84 ng/mL (ref 0.00–0.06)

## 2010-08-22 LAB — APTT
aPTT: 33 seconds (ref 24–37)
aPTT: 34 seconds (ref 24–37)

## 2010-08-22 LAB — MAGNESIUM
Magnesium: 2.2 mg/dL (ref 1.5–2.5)
Magnesium: 2.7 mg/dL — ABNORMAL HIGH (ref 1.5–2.5)

## 2010-08-22 LAB — URINALYSIS, ROUTINE W REFLEX MICROSCOPIC
Glucose, UA: NEGATIVE mg/dL
Hgb urine dipstick: NEGATIVE
Specific Gravity, Urine: 1.014 (ref 1.005–1.030)
pH: 6.5 (ref 5.0–8.0)

## 2010-08-22 LAB — PLATELET COUNT: Platelets: 104 10*3/uL — ABNORMAL LOW (ref 150–400)

## 2010-08-22 LAB — POCT I-STAT GLUCOSE
Glucose, Bld: 121 mg/dL — ABNORMAL HIGH (ref 70–99)
Operator id: 238831
Operator id: 238831

## 2010-08-22 LAB — PREPARE PLATELETS

## 2010-08-22 LAB — PROTIME-INR
INR: 1.3 (ref 0.00–1.49)
Prothrombin Time: 16.7 seconds — ABNORMAL HIGH (ref 11.6–15.2)

## 2010-08-22 LAB — PREPARE FRESH FROZEN PLASMA

## 2010-08-22 LAB — HEMOGLOBIN A1C: Hgb A1c MFr Bld: 5.7 % (ref 4.6–6.1)

## 2010-08-22 LAB — HEPARIN LEVEL (UNFRACTIONATED): Heparin Unfractionated: 0.26 IU/mL — ABNORMAL LOW (ref 0.30–0.70)

## 2010-08-22 LAB — CREATININE, SERUM
Creatinine, Ser: 0.82 mg/dL (ref 0.4–1.2)
GFR calc Af Amer: >60 mL/min (ref >60)
GFR calc non Af Amer: >60 mL/min (ref >60)

## 2010-08-22 LAB — ABO/RH: ABO/RH(D): A POS

## 2010-08-22 LAB — BRAIN NATRIURETIC PEPTIDE: Pro B Natriuretic peptide (BNP): 1532 pg/mL — ABNORMAL HIGH (ref 0.0–100.0)

## 2010-09-26 NOTE — Assessment & Plan Note (Signed)
OFFICE VISIT   [name], SENSENEY  DOB:  [birth year]                                        November 12, 2008  CHART #:  [id number]   CURRENT PROBLEMS:  1. Status post coronary artery bypass graft x4, Oct 07, 2008, for      class IV unstable angina with preoperative myocardial infarction.  2. Diabetes mellitus.  3. Hypertension.  4. Hyperlipidemia.   PRESENT ILLNESS:  The patient is a 77 year old Caucasian female who  underwent urgent bypass grafting after percutaneous intervention of  severe LAD disease that was unsuccessful.  She also had multivessel  disease with EF of 40% with inferior wall hypokinesia and underwent left  IMA grafting to the LAD, saphenous vein graft to the diagonal, saphenous  vein graft to the circumflex, and saphenous vein graft to the posterior  descending.  Postoperatively, she had ventricular ectopy and some CHF  and required brief reintubation.  Her cardiac rhythm and hemodynamics  have been significantly improved, and she progressed well and was  discharged home on the eighth postoperative day.  She has had no  recurrent angina and is walking around the home.  The surgical incisions  are healing well.  She remains on her discharge medications including  aspirin, Lipitor, Coreg 3.125 b.i.d., amiodarone 200 mg daily, fish oil,  Xanax, metformin, and Synthroid.   PHYSICAL EXAMINATION:  Blood pressure is 140/70, pulse is 80 and  regular, respirations 18, saturation 98%.  She is alert and pleasant.  Breath sounds are clear and equal.  The sternum is stable and well  healed.  Cardiac rhythm is regular without murmur.  The leg incision is  well healed, and there is no peripheral edema.   Her PA and lateral chest x-ray shows clear lung fields without pleural  effusion.  The cardiac silhouette is stable, and the sternal wires are  intact.   IMPRESSION AND PLAN:  Good initial recovery after an urgent operation  and somewhat of a  problematic postoperative course.  I told her she  could resume driving, light activity, do not lift more than 15 pounds  until January 12, 2009.  She will continue her current medications and  has not requested any further pain medication.   Of note, she does have bilateral carotid disease, moderate in severity,  with 60-80% bilateral stenosis by preoperative duplex ultrasound.  I  recommended to her that she speak with her cardiologist about repeating  the scan in the next 10-12 months.  She has no symptoms of carotid  disease.   The patient will return to the care of her cardiologist, Dr. Alanda Amass,  and her primary care, Dr. Ouida Sills, and return here as needed.   Kerin Perna, M.D.  Electronically Signed   PV/MEDQ  D:  11/12/2008  T:  11/13/2008  Job:  [id number]   cc:   Gerlene Burdock A. Alanda Amass, M.D.  Kingsley Callander. Ouida Sills, MD

## 2010-09-26 NOTE — Discharge Summary (Signed)
NAMEMarland Hensley  HELVI, ROYALS NO.:  [id number]   MEDICAL RECORD NO.:  [id number]          PATIENT TYPE:  INP   LOCATION:  2017                         FACILITY:  [facility]   PHYSICIAN:  Kerin Perna, M.D.  DATE OF BIRTH:  12/30/1926   DATE OF ADMISSION:  10/05/2008  DATE OF DISCHARGE:  10/18/2008                               DISCHARGE SUMMARY   HISTORY:  The patient is a 77 year old female who recently developed  evidence of class III exertional angina.  The patient has been having  chest pain associated with some nausea, diaphoresis, and radiation to  the left arm occurring during exertion as well as at night.  She was  transferred to the emergency room at [facility] and her cardiac enzymes  were negative.  She was evaluated and a cardiac catheterization was  recommended, and she was transferred to [facility] under the  care and management of Dr. Yates Decamp.   PAST MEDICAL HISTORY:  1. Hypothyroidism.  2. Hyperlipidemia.  3. Diabetes mellitus.  4. Hypertension.  5. Status post hysterectomy.  6. GI intolerance to DEMEROL and CODEINE.   MEDICATIONS:  Prior to admission include the following,  1. Synthroid 125 mcg daily.  2. Lipitor 20 mg daily.  3. Metformin 500 mg twice daily.  4. Lisinopril/hydrochlorothiazide 20/25 one daily, although reportedly      she was not currently taking this medicine.  5. Fish oil 1200 mg daily.  6. Aspirin 325 mg daily.  7. Xanax 1 mg as needed.  8. Darvocet-N 100 p.r.n.  9. Coreg 3.125 mg daily.   SOCIAL HISTORY:  She is married, lives with husband.  Denies smoking or  alcohol intake.   FAMILY HISTORY:  Strongly positive for coronary artery disease.  Her son  has had a percutaneous intervention in the past.  She also had a  daughter who had a cerebral hemorrhage and stroke from a cerebral  aneurysm.   REVIEW OF SYMPTOMS:  Please see history and physical.   PHYSICAL EXAMINATION:  Please see history and physical.   HOSPITAL COURSE:  The patient was admitted and transferred for cardiac  catheterization.  She was demonstrated to have severe LAD diagonal  stenosis.  An attempted PCI was unsuccessful due to tortuosity of the  vessels and the procedure was stopped.  The diagonal branch of the LAD  was partially dissected and filled by collaterals.  Otherwise, the right  coronary was chronically occluded and the circumflex had no significant  disease.  Ejection fraction was 50% with some inferior hypokinesis and  there was no evidence of mitral regurgitation.  A 2-D echocardiogram was  then done and showed mild aortic insufficiency and mild aortic stenosis  with a 14 mmHg gradient across the aortic valve.  Due to this, surgical  intervention was recommended and consultation was obtained with Kerin Perna, MD, who evaluated the patient and studies and agreed with the  recommendations to proceed with surgical revascularization.  The patient  was placed on intervenous heparin and nitroglycerin following her  cardiac procedure.  Her cardiac enzymes were positive and the patient  was  stabilized medically.  The patient was also noted on preop Doppler  studies to have a 60-79% right ICA stenosis and 60-79% left ICA  stenosis.  This was done on carotid duplex.  She was deemed to be  acceptable to proceed with the surgical procedure and on Oct 07, 2008,  she was taken to the operating room where she underwent the following  procedure, coronary artery bypass grafting x4, and the following grafts  were placed:  1. Left internal mammary artery to the LAD.  2. Saphenous vein graft to the posterior descending.  3. Saphenous vein graft to the obtuse marginal.  4. Saphenous vein graft to the diagonal.  The patient tolerated the      procedure well and was taken to the surgical intensive care unit in      stable condition.   POSTOPERATIVE HOSPITAL COURSE:  The patient did have some difficulties  postoperatively.   She has some significant ventricular ectopy including  bigeminy and trigeminy.  She was started initially on amiodarone as well  as lidocaine.  An echocardiogram was done on Oct 10, 2008, and showed no  pericardial effusion or tamponade.  The lidocaine was discontinued.  She  was started on a course of diuresis.  She did progress, however, to  acute respiratory failure.  This ultimately did require reintubation.  We were assisted in the management by critical care medicine.  She also  requires some inotropic support during this time.  This was felt related  to LV dysfunction, status post anterior myocardial infarction.  It  improved over time and ultimately she was able to be extubated.  She was  diuresed aggressively during this.  She was weaned from inotropes as  well.  She did have some postoperative thrombocytopenia, but this is  gradually improved.  She was placed short-term on a course of heparin,  but this was discontinued.  CT scan was negative for pulmonary embolism.  She was able to be transferred to the 2000 unit and since transfer had  shown a good and gradual progression in her overall recovery.  She has  continued some ongoing diuresis while on 2000.  Her medications have  been ingested and she is having no significant cardiac ectopy at this  point.  Her diabetes management has been under control, and she has gone  from use of the Glucommander protocol to back on her oral regimen.  She  is responding slowly, but steadily in regard to cardiac rehabilitation.  She does have a postoperative acute blood loss anemia.  Her hemoglobin  and hematocrit are very stable.  The most recent values are 9.5 and 28.1  respectively on October 16, 2008.  She has had an elevated BNP during the  postoperative period, which has been fairly steadily high throughout her  recovery.  She is responding quite well clinically to diuresis and is  approximately at her preoperative void at this time.  She is  currently  not showing any evidence of clinical congestive heart failure.  Her  overall status is felt to be tentatively stable for discharge on today's  date on October 18, 2008, pending further evaluation by the cardiologist to  see if they require with her current status and medical regimen.   MEDICATIONS:  At the time of this dictation includes,  1. Synthroid 125 mcg daily.  2. Lipitor 20 mg daily.  3. Metformin 500 mg twice daily.  4. Fish oil 1200 mg daily.  5. Aspirin 325 mg  daily.  6. Xanax p.r.n.  7. Darvocet p.r.n.  8. Coreg 3.125 mg twice daily.  9. Lasix 40 mg daily for 14 days.  10.Potassium chloride 20 mEq daily for 14 days.  11.Amiodarone 200 mg twice daily.   INSTRUCTIONS:  The patient will receive written instructions in regard  to medications, activity, diet, wound care, and followup.   FOLLOWUP:  Dr. Donata Clay on November 12, 2008, at 10:30 with the chest x-ray.  She is also instructed to follow up with her cardiologist at  [facility] in 2 weeks.   CONDITION ON DISCHARGE:  Stable and improving.   FINAL DIAGNOSES:  1. Severe coronary artery disease, multivessel as described above, now      status post surgical revascularization.  2. Preoperative acute anterior myocardial infarction.  3. Preop attempted percutaneous coronary intervention.  4. Postoperative acute blood loss anemia.  5. Postoperative ventricular ectopy and nonsustained ventricular      tachycardia.  6. Postoperative respiratory failure, requiring reintubation.  7. Left ventricular dysfunction.  8. Hypothyroidism.  9. Diabetes mellitus 2.  10.Hypertension.  11.Postoperative thrombocytopenia.  12.Extracranial cerebrovascular occlusive disease.  13.Postoperative volume overload.  14.Postoperative metabolic acidosis, resolved.  15.Postoperative congestive heart failure, clinically resolving.      Rowe Clack, P.A.-C.      Kerin Perna, M.D.  Electronically Signed    WEG/MEDQ  D:   10/18/2008  T:  10/18/2008  Job:  [id number]   cc:   Kerin Perna, M.D.  Cristy Hilts. Jacinto Halim, MD  Kingsley Callander Ouida Sills, MD  [facility]

## 2010-09-26 NOTE — Assessment & Plan Note (Signed)
OFFICE VISIT   [name], [name]  DOB:  03-20-1927                                       03/30/2009  CHART#:[id number]   Ms. Suppes underwent carotid arteriogram on February 25, 2009.  This  showed some ulcerated plaque, about 60% stenosis on the right side.  She  had tortuosity of her left internal carotid artery but no significant  stenosis.  The patient apparently had an additional episode of right  hand numbness and memory loss with some right eye blurriness  approximately 2 weeks ago.  She apparently was seen in our office visit  but refused hospital admission but was thought to have had an additional  left brain stroke.  I reviewed her arteriogram today and again confirmed  that she has a 60% right internal carotid artery stenosis but no  significant stenosis on the left side.  She is currently on aspirin  therapy.  She states that most of her symptoms from 2 weeks ago have now  resolved except for the right hand numbness and some memory loss.   PHYSICAL EXAMINATION:  Neck:  Has 2+ carotid blood pulses without bruit.  Chest:  Clear to auscultation.  Cardiac:  Regular rate and without  murmur.  Neurologic:  Exam shows fairly symmetric upper extremity and  lower extremity motor strength which is 5/5.   I had a lengthy discussion with Ms. Thursby today.  Since she is now  having left brain hemispheric symptoms, I do not believe we can ascribe  these to her carotid disease since she has no carotid stenosis on the  left side.  I have discussed with her the possibility of a right carotid  endarterectomy if she had been having right brain events but currently  she is not having these.  I also discussed all of this at length with  her daughter and her husband who were present for the office visit  today.  Ms. Heiden is quite reluctant to have any operations at this  time.  I believe she would have minimal benefit from an carotid  endarterectomy  on the right side since she is primarily having left  hemispheric symptoms at this time.  Family was in agreement with this.  I believe the best management for now would be continued risk factor  modification with control for hypertension.  I will change her today  from aspirin to Plavix.  Hopefully this will provide better antiplatelet  protection for her.  She will follow up in 6 months' time for repeat  carotid duplex exam or sooner if her symptoms change.   Janetta Hora. Fields, MD  Electronically Signed   CEF/MEDQ  D:  03/30/2009  T:  03/31/2009  Job:  [id number]   cc:   Kingsley Callander. Ouida Sills, MD

## 2010-09-26 NOTE — Procedures (Signed)
CAROTID DUPLEX EXAM   INDICATION:  Follow up carotid artery disease.   HISTORY:  Diabetes:  Yes  Cardiac:  No  Hypertension:  Yes  Smoking:  No  Previous Surgery:  No  CV History:  Amaurosis Fugax No, Paresthesias No, Hemiparesis No.                                       RIGHT             LEFT  Brachial systolic pressure:         168.              160.  Brachial Doppler waveforms:         Biphasic.         Biphasic.  Vertebral direction of flow:        Antegrade.        Antegrade.  DUPLEX VELOCITIES (cm/sec)  CCA peak systolic                   41.               64.  ECA peak systolic                   323.              75.  ICA peak systolic                   225.              92.  ICA end diastolic                   53.               27.  PLAQUE MORPHOLOGY:                  Calcified.        Mixed.  PLAQUE AMOUNT:                      Severe.           Mild-to-moderate.                   PLAQUE LOCATION:  ICA, ECA.   ICA, ECA.   Left ICA is noted to be tortuous.   IMPRESSION:  1. Right external carotid artery stenosis.  2. 40-59% right internal carotid artery stenosis (upper end of range).  3. 20-39% left internal carotid artery stenosis.   This study is essentially unchanged from study of May 20, 2006 which  was done at [facility] and showed 70-90% diameter narrowing on  the right and 41-59% diameter narrowing on the left.   ___________________________________________  Janetta Hora. Darrick Penna, MD   DP/MEDQ  D:  12/25/2006  T:  12/26/2006  Job:  [id number]

## 2010-09-26 NOTE — Op Note (Signed)
NAMEMarland Kitchen  AMIRACLE, NEISES          ACCOUNT NO.:  [id number]   MEDICAL RECORD NO.:  [id number]          PATIENT TYPE:  INP   LOCATION:  2310                         FACILITY:  [facility]   PHYSICIAN:  Kerin Perna, M.D.  DATE OF BIRTH:  09-27-1926   DATE OF PROCEDURE:  10/07/2008  DATE OF DISCHARGE:                               OPERATIVE REPORT   OPERATION:  1. Coronary artery bypass grafting x4 (left internal mammary artery to      left anterior descending, saphenous vein graft to second diagonal,      saphenous vein graft to circumflex marginal, saphenous vein graft      to posterior descending).  2. Endoscopic harvest of right leg greater saphenous vein.   SURGEON:  Kerin Perna, MD   ASSISTANT:  Tressie Stalker, MD and Gershon Crane, PA-C   ANESTHESIA:  General.   PREOPERATIVE DIAGNOSIS:  Class IV unstable angina with severe  multivessel coronary artery disease.   POSTOPERATIVE DIAGNOSIS:  Class IV unstable angina with severe  multivessel coronary artery disease.   INDICATIONS:  The patient is a 77 year old female with exertional and  also resting angina of an accelerating nature.  A cardiac  catheterization in 48 hours previously showed tight disease in the LAD  proximally and tight disease in the LAD diagonal bifurcation distally.  Attempted percutaneous intervention was unsuccessful and surgical  revascularization was recommended as the patient also had a chronic  occlusion of the right coronary and a moderate stenosis of the  circumflex marginal.  Her EF was 40% with inferior wall hypokinesia.  There was no significant valve disease on echo.  I examined the patient  in her CCU room and reviewed results of the cardiac cath with the  patient and family.  I discussed the indications and expected benefits  of multivessel coronary artery bypass surgery for treatment of her  multivessel coronary artery disease.  I reviewed the major aspects of  the operation including the use  of general anesthesia and  cardiopulmonary bypass, the location of the surgical incisions, the  choice of conduit to include internal mammary artery and endoscopically  harvested saphenous vein, and the expected postoperative hospital  recovery.  I discussed with the patient the risks to her of this  operation including the risks of heart attack, stroke, bleeding, blood  transfusion requirement, infection, and death.  After reviewing these  issues, she demonstrated her understanding and agreed to proceed with  surgery under what I felt was an informed consent.   OPERATIVE FINDINGS:  The coronaries were small but graftable targets.  The saphenous vein was of good conduit quality.  The patient had a  hemoglobin of less than 7 grams during the surgery and received 2 units  of packed cells.   PROCEDURE:  The patient was brought to the operating room and placed  supine on the operating table where general anesthesia was induced.  A  transesophageal 2-D echo probe was placed by the anesthesiologist which  confirms of some mild-to-moderate LV dysfunction and 1+ AI and 1+ MR.  The chest, abdomen and legs were prepped and draped as a  sterile field.  A sternal incision was made as the saphenous vein was harvested  endoscopically from the right leg.  The left internal mammary artery was  harvested as a pedicle graft from its origin at the subclavian vessels.  The sternal retractor was placed and the pericardium was opened and  suspended.  Pursestrings were placed in the ascending aorta and right  atrium and after the vein had been inspected and found to be adequate  the patient was heparinized and cannulated.  The patient was then placed  on cardiopulmonary bypass and the coronaries were identified for  grafting and the mammary artery and vein grafts were prepared for the  distal anastomoses.  Cardioplegic catheters were placed both antegrade  and retrograde cardioplegia delivery.  The patient was  cooled to 32  degrees and aortic cross-clamp was applied.  800 mL of cold blood  cardioplegia was delivered in split doses between the antegrade aortic  and retrograde coronary sinus catheters.  There was good cardioplegic  arrest and septal temperature dropped less than 12 degrees.  Cardioplegia was delivered every 20 minutes or less while the cross-  clamp was applied.   The distal coronary anastomoses were then performed.  The first distal  anastomosis was to the poster descending branch of the right coronary.  It was totally occluded proximally.  A reverse saphenous vein was sewn  to this 1.4-mm vessel with running 7-0 Prolene.  There was a good flow  through the graft.  The second distal anastomosis was circumflex  marginal branch of the left coronary.  This had a proximal 60-70%  stenosis and the reverse saphenous vein was sewn end-to-side with  running 7-0 Prolene with good flow through the graft.  The third distal  anastomosis was the second diagonal branch of the LAD.  This had been  occluded during the attempted PCI 48 hours earlier.  There was a 1.2-mm  vessel with proximal calcified 90% stenosis and the reverse saphenous  vein was sewn end-to-side with running 8-0 Prolene with good flow  through the graft.  Cardioplegia was redosed.  The fourth and final  distal anastomosis was placed in the distal third of the LAD beyond the  takeoff of the diagonal branch.  It was a 1.5 mm vessel with a proximal  tight 95% stenosis.  The left IMA pedicle was brought through an opening  created in the left lateral pericardium and was brought down onto the  LAD and sewn end-to-side with running 8-0 Prolene.  There was good flow  through the anastomosis after briefly releasing the pedicle bulldog on  the mammary artery.  Bulldog was reapplied and the pedicle was secured  to the epicardium.  The cardioplegia was redosed.   While the cross-clamp was still in place, three proximal vein   anastomoses were performed on the ascending aorta using a 4.0-mm punch  running 7-0 Prolene.  Prior to tying down the final proximal  anastomosis, air was vented from the coronaries with a dose of  retrograde warm blood cardioplegia.   Cross-clamp was removed and the heart resumed a spontaneous rhythm.  Air  was aspirated from the vein grafts with a 27-gauge needle.  The grafts  had good flow and hemostasis was documented in the proximal and distal  sites.  The cardioplegic catheters were removed.  Temporary pacing wires  were applied and the patient was rewarmed.  When the patient was  adequately rewarmed, the lungs were re-expanded and the ventilator was  resumed.  The patient was weaned for bypass on low-dose dopamine with  stable blood pressure and a paced rhythm.  The cardiac output and blood  pressure were stable off bypass and protamine was administered without  adverse reaction.  The cannulas were removed.  The patient still had  diffuse coagulopathy and a unit of platelets and FFP were administered.  The mediastinum was irrigated with warm antibiotic irrigation and the  leg incision was irrigated and closed in a standard fashion.  The  superior pericardial fat was closed over the aorta.  Two mediastinal and  a left pleural chest tube were  placed and brought out through separate incisions.  The sternum was  closed with interrupted steel wire.  The pectoralis fascia and  subcutaneous layers were closed using running Vicryl.  The skin was  closed with a subcuticular and sterile dressings were applied.  Total  bypass time was 134 minutes.      Kerin Perna, M.D.  Electronically Signed     PV/MEDQ  D:  10/07/2008  T:  10/08/2008  Job:  [id number]   cc:   Cristy Hilts. Jacinto Halim, MD  Kingsley Callander Ouida Sills, MD

## 2010-09-26 NOTE — Procedures (Signed)
CAROTID DUPLEX EXAM   INDICATION:  Follow up known carotid artery disease.   HISTORY:  Diabetes:  No.  Cardiac:  CABG 3 times and MI.  Hypertension:  Yes.  Smoking:  No.  Previous Surgery:  None.  CV History:  Recent CVA about a week ago.  Amaurosis Fugax No, Paresthesias No, Hemiparesis No.                                       RIGHT             LEFT  Brachial systolic pressure:         144               148  Brachial Doppler waveforms:         Biphasic          Biphasic  Vertebral direction of flow:        Antegrade         Antegrade  DUPLEX VELOCITIES (cm/sec)  CCA peak systolic                   66                66  ECA peak systolic                   223               53  ICA peak systolic                   316               120  ICA end diastolic                   69                38  PLAQUE MORPHOLOGY:                  Calcified         Heterogenous  PLAQUE AMOUNT:                      Moderate          Mild  PLAQUE LOCATION:                    ICA, ECA          ICA, ECA    IMPRESSION:  1. 60-79% stenosis noted in the right internal carotid artery.  2. Low range of 40-59% stenosis noted in the left internal carotid      artery.  3. Antegrade bilateral vertebral arteries.   ___________________________________________  Janetta Hora Fields, MD   MG/MEDQ  D:  02/09/2009  T:  02/09/2009  Job:  [id number]

## 2010-09-26 NOTE — Assessment & Plan Note (Signed)
OFFICE VISIT   CHENOA, LUDDY  DOB:  April 10, 1927                                       12/25/2006  CHART#:[id number]   Ms. Parzych returns today for follow-up of an asymptomatic carotid  stenosis.  She is a 77 year old female who was last seen in February  2008.  She continues to have no symptoms of TIA, amaurosis or stroke.  She has been doing well overall.  Risk factors continue to include  hypertension, elevated cholesterol, diabetes.   Medications are unchanged.   PHYSICAL EXAMINATION:  Blood pressure is 166/73 in the left arm, 161/82  in the right arm, heart rate 67 and regular.  HEENT:  Unremarkable.  She has 2+ carotid pulses without bruit.  ABDOMEN:  Soft, nontender and nondistended with an easily palpable  aortic pulsation.  She has 2+ femoral pulses.   She had an aortic ultrasound today to rule out abdominal aortic  aneurysm.  Her aorta was mildly dilated distally at 2.3 cm.  However,  considering her age I do not believe this will be of any concern long-  term.  Additionally, she had a carotid duplex exam today which showed a  40-60% right internal carotid artery stenosis and a less than 40% left  internal carotid artery stenosis.  This is significantly less than her  previous Duplex exam that was performed at [facility].   I believe we can continue to safely follow Ms. Castorena's carotid  disease with risk factor modification including aspirin therapy and  control of her hypertension and cholesterol.  She will  continue to take her aspirin daily.  We will see her again for repeat  duplex exam in 6 months' time.  If she develops symptoms of carotid  disease prior to this she will return sooner.   Janetta Hora. Fields, MD  Electronically Signed   CEF/MEDQ  D:  12/25/2006  T:  12/27/2006  Job:  [id number]   cc:   Kingsley Callander. Ouida Sills, MD

## 2010-09-26 NOTE — Assessment & Plan Note (Signed)
OFFICE VISIT   [name], [name]  DOB:  Jun 23, 1926                                       02/09/2009  CHART#:[id number]   Ms. [name] is a 77 year old female whom I had previously seen for a  moderate carotid stenosis in August 2008.  She apparently had a left  brain stroke on September 28th.  Her symptoms were syncope in nature on  September 23rd.  This then proceeded to right hand numbness.  She also  had hypertension at that time with blood pressure elevated as high as  195/90.  She apparently had been off of all her medications for blood  pressure since undergoing coronary artery bypass grafting in May 2010.  She states that since the stroke she has had difficulty remembering who  members of her church are but she seems to recognize family members.   Her atherosclerotic risk factors include age, diabetes, hypertension,  coronary artery disease, and elevated cholesterol.  She denies prior  history of stroke.   PAST SURGICAL HISTORY:  She had coronary bypass grafting and  hysterectomy.   MEDICATIONS:  1. Metformin 500 mg twice a day.  2. Lisinopril/hydrochlorothiazide 20/25 once a day.  3. Metoprolol ER 25 mg once a day.  4. Synthroid 0.0125 mg once a day.  5. Lipitor 20 mg q.h.s.  6. Temazepam 15 mg q.h.s.  7. Propoxyphene 100/650 Tylenol p.r.n.  8. Alprazolam 1 mg 3 times daily p.r.n.  9. Aspirin 81 mg 2 tablets a day.   PAST MEDICAL HISTORY:  Otherwise unremarkable.   FAMILY HISTORY:  Unremarkable.   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  She is  a nonsmoker, nonconsumer of alcohol.   REVIEW OF SYSTEMS:  She is 5 foot 4, 124 pounds.  She has had some  recent weight loss and decrease in appetite.  Cardiac, pulmonary, GI,  renal, hematologic, vascular, neurologic, psychiatric, and ENT review of  systems are all negative except as mentioned above.  Orthopedic:  She  has multiple joint arthritis pain.   PHYSICAL EXAM:  Blood pressure is 150/78 in the left arm, 154/70 in the  right arm.  Pulse is 63 and regular.  HEENT:  Unremarkable.  NECK:  2+ carotid pulses without bruit.  CHEST:  Clear to auscultation.  HEART:  Exam is regular rate and rhythm without murmur.  ABDOMEN:  Soft, nontender, nondistended, with easily palpable aortic  pulsation.  EXTREMITIES:  She has 2+ femoral, 1+ popliteal, and 2+ posterior tibial  pulses bilaterally.  She does not have dorsalis pedis pulses  bilaterally.  Feet are pink, warm, and well perfused.  NEUROLOGIC:  Exam shows symmetric upper extremity and lower extremity  motor strength which is 5/5.  She has no pronator drift.   She had a repeat carotid duplex exam today which shows a 60% to 80%  right internal carotid artery stenosis and a 40% to 60% left internal  carotid artery stenosis which was at the lower end of the 40 to 60  range.  She had antegrade vertebral flow bilaterally.   MRI of the head dated February 08, 2009, from [facility] was  reviewed which shows an acute moderate sized nonhemorrhagic left  hemispheric infarct involving the left temporo-occipital lobes as well  as the thalamus and corpus callosum.   In summary,  Ms. [name] has recently had a left brain stroke.  She does  have a moderate left carotid stenosis but this is not very severe.  She  certainly could have ulcerated plaque as a cause of embolic phenomena  for her stroke.  I believe the best option for her would be a carotid  angiogram to further define the level of stenosis and determine whether  or not there was ulcerative plaque on the left side.  If there is no  significant ulceration, probably the best management for her would be  continued risk factor modification.  As far as her right-sided carotid  stenosis is concerned, I would consider this to be asymptomatic and  unless it becomes more progressive greater than 80%, I would also treat  this medically at  this point.  Ms. [name] will be scheduled for her  carotid angiogram on Friday, February 25, 2009.  The risks, benefits,  possible complications, and the procedure details including but not  limited to bleeding, infection, stroke risk of 1%, were explained to the  patient.  She understands and agrees to proceed.   Janetta Hora. Fields, MD  Electronically Signed   CEF/MEDQ  D:  02/16/2009  T:  02/17/2009  Job:  [id number]

## 2010-09-26 NOTE — Consult Note (Signed)
NAMEMarland Kitchen  [name], [name] NO.:  [id number]   MEDICAL RECORD NO.:  [id number]          PATIENT TYPE:  INP   LOCATION:  2921                         FACILITY:  [facility]   PHYSICIAN:  Kerin Perna, M.D.  DATE OF BIRTH:  19-Jun-1926   DATE OF CONSULTATION:  10/06/2008  DATE OF DISCHARGE:                                 CONSULTATION   REFERRING PHYSICIAN:  Vonna Kotyk R. Jacinto Halim, MD   PRIMARY CARE PHYSICIAN:  Kingsley Callander. Ouida Sills, MD   REASON FOR CONSULTATION:  Severe two-vessel coronary artery disease with  progressive angina.   CHIEF COMPLAINT:  Chest pain.   HISTORY OF PRESENT ILLNESS:  I was asked to evaluate this very nice 77-  year-old Caucasian female for possible multivessel coronary bypass  surgery after recent diagnosis of severe two-vessel coronary artery  disease and class III exertional angina.  The patient has been having  chest pain associated with some nausea, diaphoresis, and radiation to  the left arm occurring during exertion as well as at night.  She was  transferred to the emergency room at [facility] and her cardiac enzymes  were negative.  She was evaluated with a cardiac catheterization by Dr.  Jacinto Halim at [facility], which demonstrated severe LAD diagonal stenosis.  An attempt at PCI was unsuccessful due to tortuosity of the vessels and  the procedure was stopped.  The diagonal branch of the LAD was partially  dissected and filled via collaterals.  Otherwise, the right coronary was  chronically occluded and the circumflex had no significant disease.  Her  EF was 50% with some inferior hypokinesia and there was no evidence of  MR.  A 2-D echo performed today shows mild 1+ AI and mild AS with a 14-  mm gradient across the aortic valve.  Because of the inability to treat  her LAD diagonal disease percutaneously, surgical revascularization was  recommended.  The patient had been stable on IV heparin and  nitroglycerin since the procedure.  Her cardiac enzymes have  slowly  trended downwards with an initial CPK-MB of 130.  A 2-D echo today  showed well-preserved LV function.   PAST MEDICAL HISTORY:  1. Hypothyroidism.  2. Hyperlipidemia.  3. Diabetes mellitus.  4. Hypertension.  5. Status post hysterectomy.  6. GI intolerance to Demerol and codeine.   HOME MEDICATIONS:  Synthroid, Lipitor, metformin,  lisinopril/hydrochlorothiazide 20/25, fish oil, aspirin 81 mg, Xanax 1  mg p.r.n., and Coreg 3.125 mg b.i.d.   SOCIAL HISTORY:  The patient is married, lives with husband, and denies  smoking or alcohol intake.   FAMILY HISTORY:  Strongly positive for coronary disease.  Her son has  had percutaneous intervention of a coronary stenosis.  One daughter had  a cerebral hemorrhage and stroke from a cerebral aneurysm.   REVIEW OF SYSTEMS:  CONSTITUTIONAL:  Negative for fever, weight loss.  ENT:  Negative for difficulty swallowing.  THORACIC:  Negative for  history of abnormal chest x-ray or thoracic trauma.  GI:  Negative for  hepatitis, jaundice, or blood per rectum.  UROLOGIC:  Negative for UTI  or kidney stones.  Her urinalysis is  unremarkable.  Endocrine:  Positive  for diabetes and thyroid disease, well controlled with medication.  VASCULAR:  Negative for DVT, claudication, or TIA.  She has been seen by  Dr. Darrick Penna for moderate bilateral carotid stenosis 40-60%.  These are  asymptomatic.  She has a 2.3 cm abdominal aneurysm by ultrasound also  being followed medically.  Approximately 7 years ago, she had an episode  of depression with suicidal issues and was hospitalized at [facility].   PHYSICAL EXAMINATION:  VITAL SIGNS:  The patient is 5 feet 2 inches and  weighs 120 pounds, blood pressure is 110/50, heart rate is 80 and sinus,  saturation 99-100%.  She is afebrile.  GENERAL:  She is alert, pleasant Caucasian female in the CCU in no  distress.  HEENT:  Normocephalic.  NECK:  Without JVD, mass, or bruit.  LYMPHATICS:  No palpable  cervical adenopathy.  CHEST:  Breath sounds are clear and there is no thoracic deformity.  CARDIAC:  2/6 systolic ejection murmur in the aortic area consistent  with aortic sclerosis.  ABDOMEN:  Soft, nontender.  EXTREMITIES:  No clubbing, cyanosis, or edema.  Peripheral pulses are  intact.  NEUROLOGIC:  Nonfocal and she is alert and oriented.   LABORATORY DATA:  Reviewed the coronary care with Dr. Jacinto Halim and  discussed the situation in the cath lab.  I agree with the plan to keep  her in the hospital and to perform surgical revascularization to the LAD  diagonal and distal RCA at this time.  I discussed the procedure,  benefits, risks, and expected recovery with the patient and her husband  and she understands and agrees to proceed with surgery in the a.m.      Kerin Perna, M.D.  Electronically Signed     PV/MEDQ  D:  10/06/2008  T:  10/07/2008  Job:  [id number]

## 2010-09-26 NOTE — Cardiovascular Report (Signed)
NAMEMarland Kitchen  [name], [name]          ACCOUNT NO.:  [id number]   MEDICAL RECORD NO.:  [id number]          PATIENT TYPE:  INP   LOCATION:  2921                         FACILITY:  [facility]   PHYSICIAN:  Cristy Hilts. Jacinto Halim, MD       DATE OF BIRTH:  Apr 19, 1927   DATE OF PROCEDURE:  10/05/2008  DATE OF DISCHARGE:                            CARDIAC CATHETERIZATION   PROCEDURE PERFORMED:  1. Left ventriculography.  2. Selective right and left coronary arteriography.  3. Attempted percutaneous transluminal coronary angioplasty of the mid      and distal left anterior descending.   INDICATIONS:  [name] is a 77 year old female with  hypertension, diabetes, and hyperlipidemia who had been having symptoms  of unstable angina 3 days ago, which had resolved.  Given her classic  history suggestive of coronary disease, she is now brought to the  cardiac catheterization lab to evaluate her coronary anatomy.   Intervention was attempted as the diagonals, both diagonal I and  diagonal II were small and the lesion was right at the bifurcation.   HEMODYNAMIC DATA:  The left ventricular pressure was 129/6 with an end-  diastolic pressure of 20 mmHg.  Aortic pressure was 114/58 with a mean  of 83 mmHg.  There was a 14 mm pressure gradient across the aortic  valve.   ANGIOGRAPHIC DATA:  Left ventricle.  Left ventricular systolic function  was in the lower limit of normal with an ejection fraction of 45-50%  with inferior akinesis.  No significant mitral regurgitation.   Right coronary artery.  Right coronary artery is occluded in the  proximal to mid segment.  Distal right coronary artery is dominant and  is supplied by collaterals from the LAD and circumflex coronary artery.   Left main coronary artery.  Left main coronary artery is very short and  bifurcates immediately.   Circumflex artery.  Circumflex artery is a very large-caliber vessel and  gives origin to large OM-1 and OM-2.  There is  mild luminal  irregularity.   LAD.  LAD is a large-caliber vessel in the proximal segment with a 95%  high-grade stenosis at the bifurcation of diagonal I.  Mid-to-distal  segment gives origin to a small-to-moderate-sized diagonal II, which  again has 80-90% stenoses and has acute bend and tortuosity.   INTERVENTION DATA:  Unsuccessful attempt at PTCA of the distal LAD.  In  spite of using a balloon support, I was unable to cross the stenosis in  the LAD.  The wire kept prolapsing into the diagonal.  Hence, we decided  to abandon the procedure.   During the process, she did lose a small-to-moderate-sized diagonal II  and she will be medically treated and I will discuss with my colleagues  and Dr. Kathlee Nations Trigt regarding possibility of bypass surgery.   TECHNIQUE OF PROCEDURE:  Under usual sterile precautions using a 6-  French right radial access, a high-grade catheter was utilized to  perform selective left coronary arteriography.  The catheter was then  pulled out and a 5-French Judkins right diagnostic catheter was utilized  to engage the right coronary artery  and angiography was performed.  A  pigtail catheter was utilized to perform left ventriculography in both  LAO and RAO projection.   TECHNIQUE OF INTERVENTION:  Using Angiomax ventricle ablation, the 5-  French sheath was exchanged to a 6-French sheath and a 6-French XB 3.0  guide was utilized to engage the left main coronary artery.  I attempted  to cross the LAD stenosis with a Asahi Prowater guidewire and because of  inability to cross through the distal lesion, at 2.0 x 10-mm angioscope  balloon was utilized to attempt to cross the stenosis with backup  support, but I was still again unable to cross this.  I did have a  dissection into the diagonal branch eventually leading to loss of the  diagonal II branch.  We felt that the procedure cannot be performed in a  safe manner, hence we decided to abandon the  procedure.   The patient did have chest pain during the procedure.  I suspect she  will probably have a small nontransmural myocardial infarction.   Overall she was comfortable when she left the cath lab.      Cristy Hilts. Jacinto Halim, MD  Electronically Signed     JRG/MEDQ  D:  10/05/2008  T:  10/06/2008  Job:  [id number]   cc:   Kingsley Callander. Ouida Sills, MD

## 2010-09-26 NOTE — Procedures (Signed)
DUPLEX ULTRASOUND OF ABDOMINAL AORTA   INDICATION:  Possible abdominal aortic aneurysm.   HISTORY:  Diabetes:  Yes  Cardiac:  No  Hypertension:  Yes  Smoking:  No  Connective Tissue Disorder:  Family History:  The patient's daughter has had surgery for several  cerebral aneurysms.  Previous Surgery:  No   DUPLEX EXAM:         AP (cm)                   TRANSVERSE (cm)  Proximal             1.98 cm                   2.05 cm  Mid                  1.92 cm                   2.15 cm  Distal               2.11 cm                   2.35 cm  Right Iliac          0.93 cm  Left Iliac           1.04 cm   PREVIOUS:   IMPRESSION:  Mildly aneurysmal aorta distally.   ___________________________________________  Janetta Hora Darrick Penna, MD   DP/MEDQ  D:  12/25/2006  T:  12/26/2006  Job:  [id number]

## 2010-09-29 ENCOUNTER — Encounter: Payer: Self-pay | Admitting: Vascular Surgery
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
## 2010-12-14 ENCOUNTER — Other Ambulatory Visit: Payer: Medicare Other

## 2010-12-14 ENCOUNTER — Ambulatory Visit: Payer: Medicare Other | Admitting: Vascular Surgery

## 2010-12-22 ENCOUNTER — Other Ambulatory Visit: Payer: Medicare Other

## 2010-12-22 ENCOUNTER — Ambulatory Visit: Payer: Medicare Other

## 2011-03-27 HISTORY — PX: NM MYOCAR PERF WALL MOTION: HXRAD629

## 2011-05-06 IMAGING — CR DG CHEST 2V
2 series · 2 of 2 positions shown · non-contrast
Comparison: None

CLINICAL DATA: Pre cardiac cath.

CHEST - 2 VIEW

[view not recorded (1 of 2)]
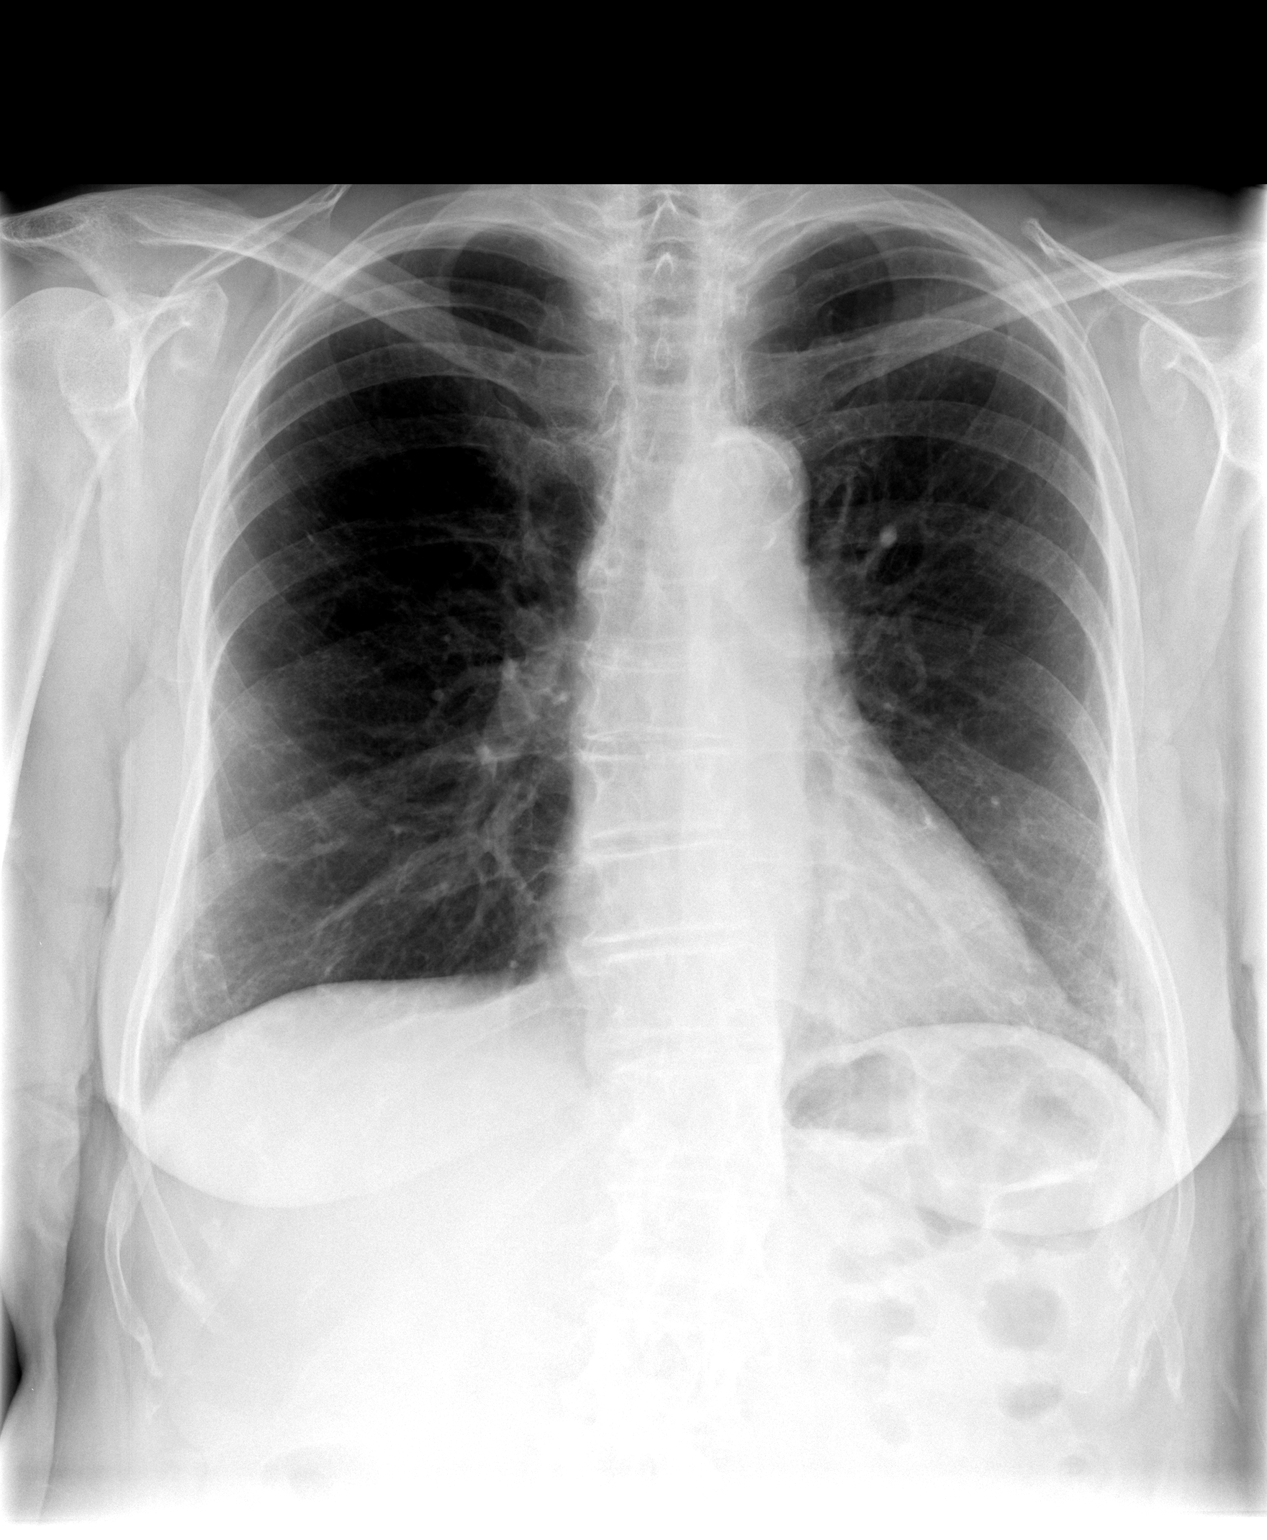

[view not recorded (2 of 2)]
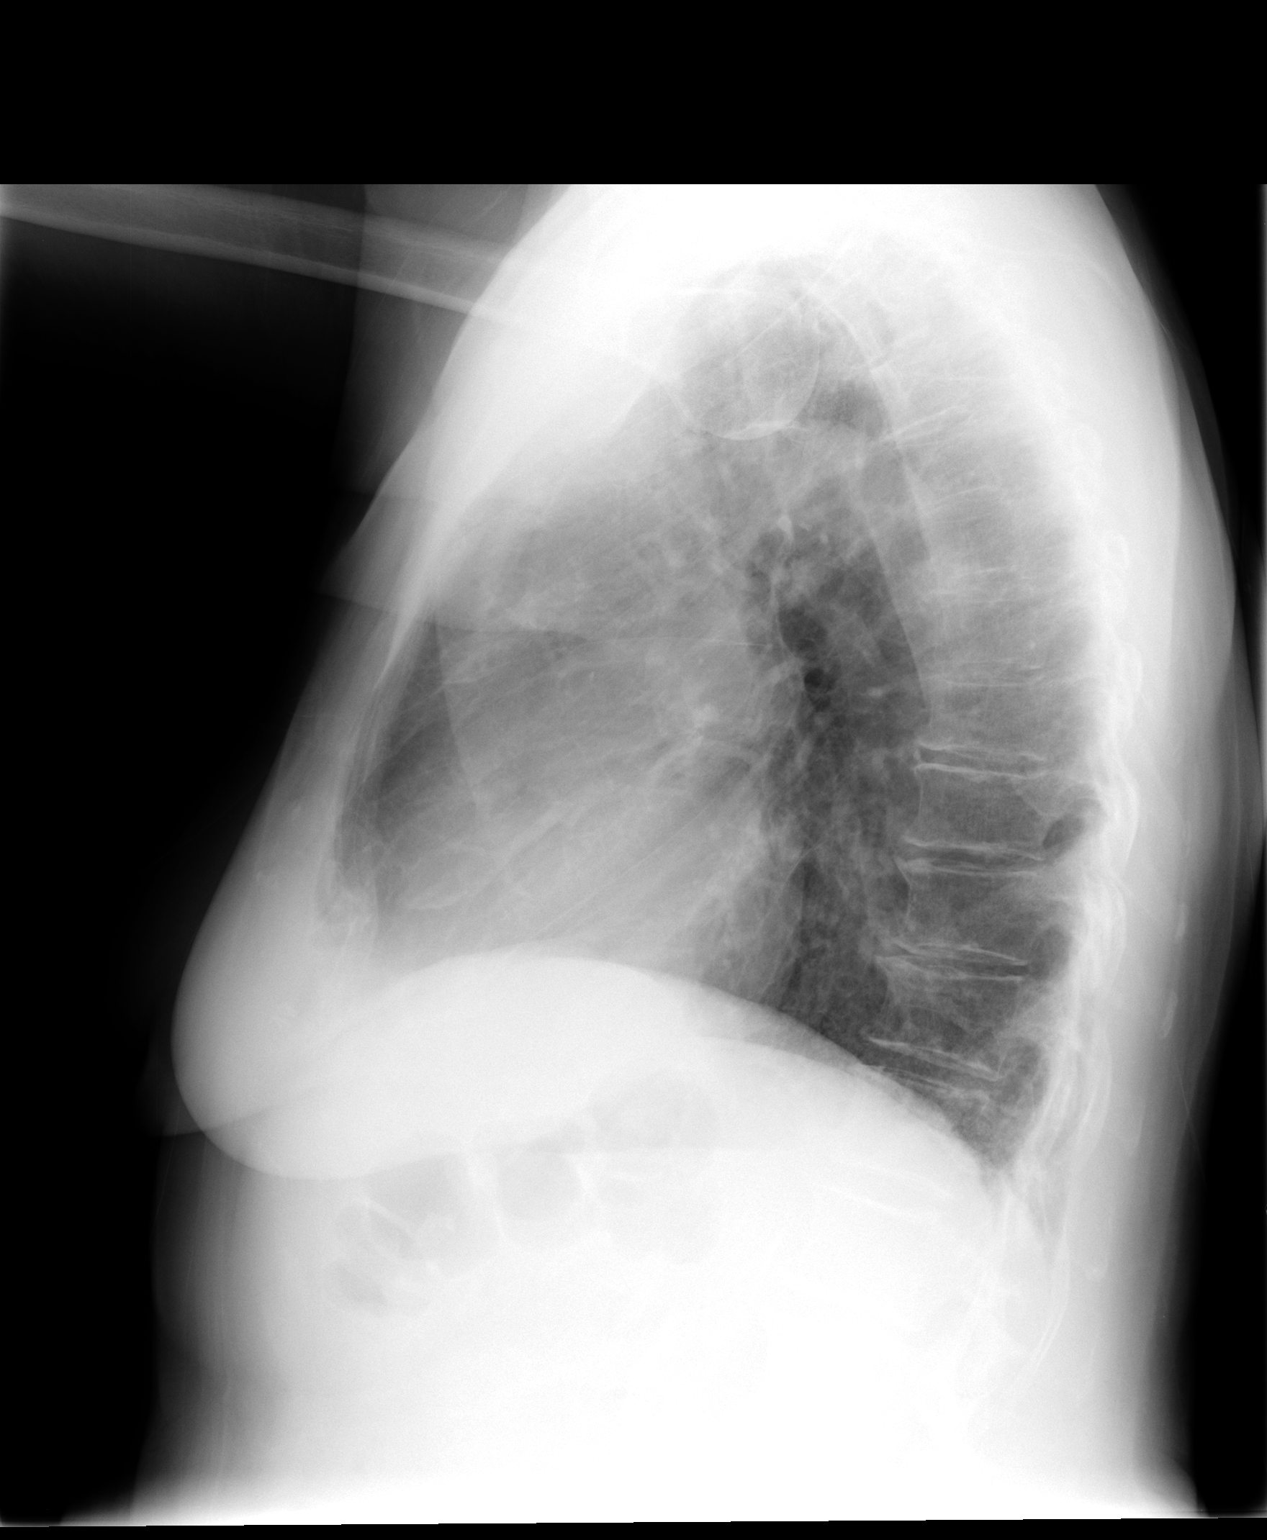

[2 of 2 positions shown; findings below may reference images not displayed]

FINDINGS: The cardiac silhouette, mediastinal and hilar contours
are within normal limits.  The lungs are clear acute process.
Bibasilar chronic appearing scarring type changes.  The bony thorax
is intact.
IMPRESSION: Chronic-appearing bibasilar scarring changes.  No acute pulmonary
findings.

## 2011-05-16 ENCOUNTER — Encounter: Payer: Self-pay | Admitting: Thoracic Diseases

## 2011-05-17 ENCOUNTER — Encounter: Payer: Self-pay | Admitting: Thoracic Diseases

## 2011-05-17 ENCOUNTER — Ambulatory Visit (INDEPENDENT_AMBULATORY_CARE_PROVIDER_SITE_OTHER): Payer: Medicare Other | Admitting: Thoracic Diseases

## 2011-05-17 ENCOUNTER — Ambulatory Visit (INDEPENDENT_AMBULATORY_CARE_PROVIDER_SITE_OTHER): Payer: Medicare Other | Admitting: *Deleted

## 2011-05-17 VITALS — BP 201/77 | HR 62 | Resp 16 | Ht 64.0 in | Wt 123.0 lb

## 2011-05-17 DIAGNOSIS — I6529 Occlusion and stenosis of unspecified carotid artery: Secondary | ICD-10-CM

## 2011-05-17 NOTE — Progress Notes (Signed)
Established Carotid Patient History of Present Illness  [name] is a 78 y.o. female who has known carotid stenosis.  Patient has not had previous CEA.  Patient has Positive history of TIA or stroke symptom in the past not related to carotid stenosis as this has been stable at less than 59%.  The patient denies amaurosis fugax or monocular blindness.  The patient  denies facial drooping.  Pt. denies hemiplegia.  The patient denies receptive or expressive aphasia.  Pt. denies weakness; BUE, BLE; The patient's previous neurologic deficits are resolved.  Pt also C/O right arm achiness and some intermittent numbness in the 1st-4th fingers of the right hand especially at night.   Non-Invasive Vascular Imaging CAROTID DUPLEX 05/17/2011   Right ICA 40 - 59 % stenosis Left ICA 40 - 59 % stenosis  Previous carotid studies demonstrated: RICA 40 - 59 % stenosis, LICA 40 - 59 % stenosis.  These findings are slightly higher from previous exam   Past Medical History  Diagnosis Date  . Stroke   . Arthritis   . Diabetes mellitus   . Hypertension   . CAD (coronary artery disease)   . Hyperlipidemia     Social History History  Substance Use Topics  . Smoking status: Never Smoker   . Smokeless tobacco: Not on file  . Alcohol Use: No    Family History History reviewed. No pertinent family history.  Review of Systems : [x]  Positive   [ ]  Negative  General:[ ]  Weight loss,  [ ]  Weight gain, [ ]  Loss of appetite, [ ]  Fever, [ ]  chills  Neurologic: [ ]  Dizziness, [ ]  Blackouts, [ ]  Headaches, [ ]  Seizure [ x]left brain Stroke 02/08/2009, [ ]  "Mini stroke", [ ]  Slurred speech, [ ]  Temporary blindness;  [ ] weakness,  Ear/Nose/Throat: [ ]  Change in hearing, [ ]  Nose bleeds, [ ]  Hoarseness  Vascular:[ ]  Pain in legs with walking, [ ]  Pain in feet while lying flat , [ ]   Non-healing ulcer, [ ]  Blood clot in vein,    Pulmonary: [ ]  Home oxygen, [ ]   Productive cough, [ ]   Bronchitis, [ ]  Coughing up blood,  [ ]  Asthma, [ ]  Wheezing  Musculoskeletal:  [ ]  Arthritis, [ ]  Joint pain, [ ]  low back pain [x] carpal tunnel symptoms on right Cardiac: [ ]  Chest pain, [ ]  Shortness of breath when lying flat, [ ]  Shortness of breath with exertion, [ ]  Palpitations, [ ]  Heart murmur, [ ]   Atrial fibrillation  Hematologic:[ ]  Easy Bruising, [ ]  Anemia; [ ]  Hepatitis  Psychiatric: [ ]   Depression, [ ]  Anxiety   Gastrointestinal: [ ]  Black stool, [ ]  Blood in stool, [ ]  Peptic ulcer disease,  [ ]  Gastroesophageal Reflux, [ ]  Trouble swallowing, [ ]  Diarrhea, [ ]  Constipation  Urinary: [ ]  chronic Kidney disease, [ ]  on HD, [ ]  Burning with urination, [ ]  Frequent urination, [ ]  Difficulty urinating;   Skin: [ ]  Rashes, [ ]  Wounds   Allergies  Allergen Reactions  . Codeine   . Demerol     Current Outpatient Prescriptions  Medication Sig Dispense Refill  . ALPRAZolam (XANAX) 1 MG tablet Take 1 mg by mouth 3 (three) times daily as needed.        Marland Kitchen amLODipine-benazepril (LOTREL) 5-10 MG per capsule Take 1 capsule by mouth daily.        Marland Kitchen aspirin 81 MG EC tablet Take 81 mg by mouth  daily.        . atorvastatin (LIPITOR) 20 MG tablet Take 20 mg by mouth daily.        . clopidogrel (PLAVIX) 75 MG tablet Take 75 mg by mouth daily.        . cyclobenzaprine (FLEXERIL) 10 MG tablet Take 10 mg by mouth daily as needed.        Marland Kitchen METFORMIN HCL PO Take 500 mg by mouth 2 (two) times daily.        . metoprolol succinate (TOPROL-XL) 25 MG 24 hr tablet Take 25 mg by mouth daily.        . metoprolol tartrate (LOPRESSOR) 25 MG tablet Take 25 mg by mouth 2 (two) times daily.        Marland Kitchen olmesartan-hydrochlorothiazide (BENICAR HCT) 20-12.5 MG per tablet Take 1 tablet by mouth daily.        . pantoprazole (PROTONIX) 40 MG tablet Take 40 mg by mouth daily.        . traMADol (ULTRAM) 50 MG tablet Take 50 mg by mouth every 8 (eight) hours as needed.        . zolpidem (AMBIEN) 10 MG tablet Take  10 mg by mouth at bedtime as needed.        Marland Kitchen levothyroxine (SYNTHROID, LEVOTHROID) 100 MCG tablet Take 100 mcg by mouth daily.          Physical Examination  Filed Vitals:   05/17/11 1353  BP: 201/77  Pulse: 62  Resp:     General: WDWN female in NAD GAIT: normal Eyes: PERRLA Pulmonary:  CTAB, Negative  Rales, Negative rhonchi, & Negative wheezing,  Cardiac: regular Rhythm ,Vascular:     RIGHT   LEF CAROTID BRUITS Negative Negative  RADIAL present present  ULNAR present present   Gastrointestinal: soft, nontender, BS WNL, no r/g,    Musculoskeletal: good and equal strength in BUE/BLE Positive phalens sign, negative compression test for carpal tunnel syndrome Neurologic: A&O X 3; Appropriate Affect ; SENSATION ;normal; MOTOR FUNCTION: normal 5/5 strength in all tested muscle groups Speech is normal  Assessment: [name] is a 78 y.o. female who presents with:Asymptomatic40 - 59 % Bilateral ICA  stenosis The  ICA stenosis is  Unchanged from previous exam.  Plan: Follow-up in 1 years with Carotid Duplex scan and letter  Pt was given information regarding stroke symptoms and prevention Possible carpal tunnel syndrome on right  Clinic MD: Vivi Martens, MD

## 2011-05-17 NOTE — Patient Instructions (Signed)
Stroke Prevention Some medical conditions and some behaviors are associated with an increased chance of having a stroke. You may prevent a stroke by making healthy choices and managing medical conditions. You can reduce your risk of having a stroke by:  Staying physically active. It is recommended that you get at least 30 minutes of activity on most or all days.   Stop smoking.   Limiting alcohol use. Moderate alcohol use is considered to be: No more than 2 drinks per day for men. No more than 1 drink per day for non-pregnant women.   DIET.  A low fat, low cholesterol, low salt and high fiber diet with servings of fruits and vegetables daily will help .   Managing your cholesterol levels.. Take any prescribed medications to control cholesterol as directed by your caregiver.   Managing your diabetes. Diabetic diet as recommended by the ADA. Take any prescribed medications to control diabetes as directed by your caregiver.   Controlling your high blood pressure (hypertension). It is very important to take any prescribed medications to control hypertension .   Aspirin: Take Aspirin daily as prescribed by your physician. Do not take aspirin with blood thinners unless prescribed by your Physician  You may be on "blood thinners" (anticoagulants) Coumadin, Plavix, Agrennox, Effient. All these medications are helpful in reducing the risk of forming abnormal blood clots. If you have the irregular heart rhythm of atrial fibrillation, you may be on a blood thinner.  Be sure you understand all your medication instructions.   SIGNS OF STROKE: SEEK IMMEDIATE MEDICAL CARE IF: You have sudden weakness or numbness of the face, arm, or leg, especially on 1 side of the body.  You have sudden confusion.  You have trouble speaking (aphasia) or understanding.  You have sudden trouble seeing in 1 or both eyes.  You have sudden trouble walking.  You have dizziness.  You have a loss of balance or coordination.   You have a sudden severe headache with no known cause.    You have new chest pain, angina, or an irregular heartbeat.   ANY OF THESE SYMPTOMS MAY REPRESENT A SERIOUS PROBLEM THAT IS AN EMERGENCY. Do not wait to see if the symptoms will go away. Get medical help right away. Call your local emergency services (911 in [place] DO NOT drive yourself to the hospital. Stroke Prevention Some medical conditions and some behaviors are associated with an increased chance of having a stroke. You may prevent a stroke by making healthy choices and managing medical conditions. You can reduce your risk of having a stroke by:  Staying physically active. It is recommended that you get at least 30 minutes of activity on most or all days.   Not smoking.   Limiting alcohol use. Moderate alcohol use is considered to be:   No more than 2 drinks per day for men.   No more than 1 drink per day for nonpregnant women.   Eating healthy foods. Certain diets may be prescribed to address high blood pressure, high cholesterol, diabetes, or obesity. A diet that includes 5 or more servings of fruits and vegetables a day may reduce the risk of stroke.   Managing your cholesterol levels. A low saturated fat, low trans fat, low cholesterol, and high fiber diet may control cholesterol levels. Take any prescribed medications to control cholesterol as directed by your caregiver.   Managing your diabetes. A controlled carbohydrate, controlled sugar diet is recommended to manage diabetes. Take any prescribed medications to   control diabetes as directed by your caregiver.   Controlling your high blood pressure (hypertension). A low salt (sodium), low saturated fat, low trans fat, and low cholesterol diet is recommended to manage high blood pressure. Take any prescribed medications to control hypertension as directed by your caregiver.   Maintaining a healthy weight. A reduced calorie, low sodium, low saturated fat, low trans fat, low  cholesterol diet is recommended to manage weight.   Stopping drug abuse.   Taking medications as directed by your caregiver. For some individuals, aspirin or "blood thinners" (anticoagulants) are helpful in reducing the risk of forming abnormal blood clots that can lead to stroke. If you have the irregular heart rhythm of atrial fibrillation, you should be on a blood thinner unless there is a good reason you cannot take them. Be sure you understand all your medication instructions.  SEEK IMMEDIATE MEDICAL CARE IF:  You have sudden weakness or numbness of the face, arm, or leg, especially on 1 side of the body.   You have sudden confusion.   You have trouble speaking (aphasia) or understanding.   You have sudden trouble seeing in 1 or both eyes.   You have sudden trouble walking.   You have a loss of balance or coordination.   You have a sudden severe headache with no known cause.    ANY OF THESE SYMPTOMS MAY REPRESENT A SERIOUS PROBLEM THAT IS AN EMERGENCY. Do not wait to see if the symptoms will go away. Get medical help right away. Call your local emergency services (911 in [place] DO NOT drive yourself to the hospital. Document Released: 06/07/2004 Document Re-Released: 10/18/2009 [employer] Patient Information 2011 [employer].  

## 2011-05-25 NOTE — Procedures (Unsigned)
CAROTID DUPLEX EXAM  INDICATION:  Carotid stenosis.  HISTORY: Diabetes:  No. Cardiac:  CABG x3, MI. Hypertension:  Yes. Smoking:  No. Previous Surgery:  No. CV History:  Asymptomatic. Amaurosis Fugax No, Paresthesias No, Hemiparesis No.                                      RIGHT             LEFT Brachial systolic pressure:         147               141 Brachial Doppler waveforms:         Normal            Normal Vertebral direction of flow:        Antegrade/stenotic                  Antegrade DUPLEX VELOCITIES (cm/sec) CCA peak systolic                   67                64 ECA peak systolic                   378               73 ICA peak systolic                   243               120 ICA end diastolic                   56                42 PLAQUE MORPHOLOGY:                  Mixed             Mixed PLAQUE AMOUNT:                      Moderate          Moderate PLAQUE LOCATION:                    ICA, ECA          ICA, ECA  IMPRESSION: 1. Right internal carotid artery velocities suggest 40% to 59%     stenosis (high end of range). 2. Left internal carotid artery velocities suggest 40% to 59%     stenosis. 3. Right external carotid artery stenosis. 4. Right vertebral artery stenosis. 5. Stable in comparison to the last examination.  ___________________________________________ Janetta Hora Fields, MD  EM/MEDQ  D:  05/18/2011  T:  05/18/2011  Job:  [id number]

## 2011-07-19 IMAGING — CR DG CHEST 2V
2 series · 2 of 2 positions shown · non-contrast
Comparison: 11/12/2008

CLINICAL DATA: Cough, shortness of breath

CHEST - 2 VIEW

[view not recorded (1 of 2)]
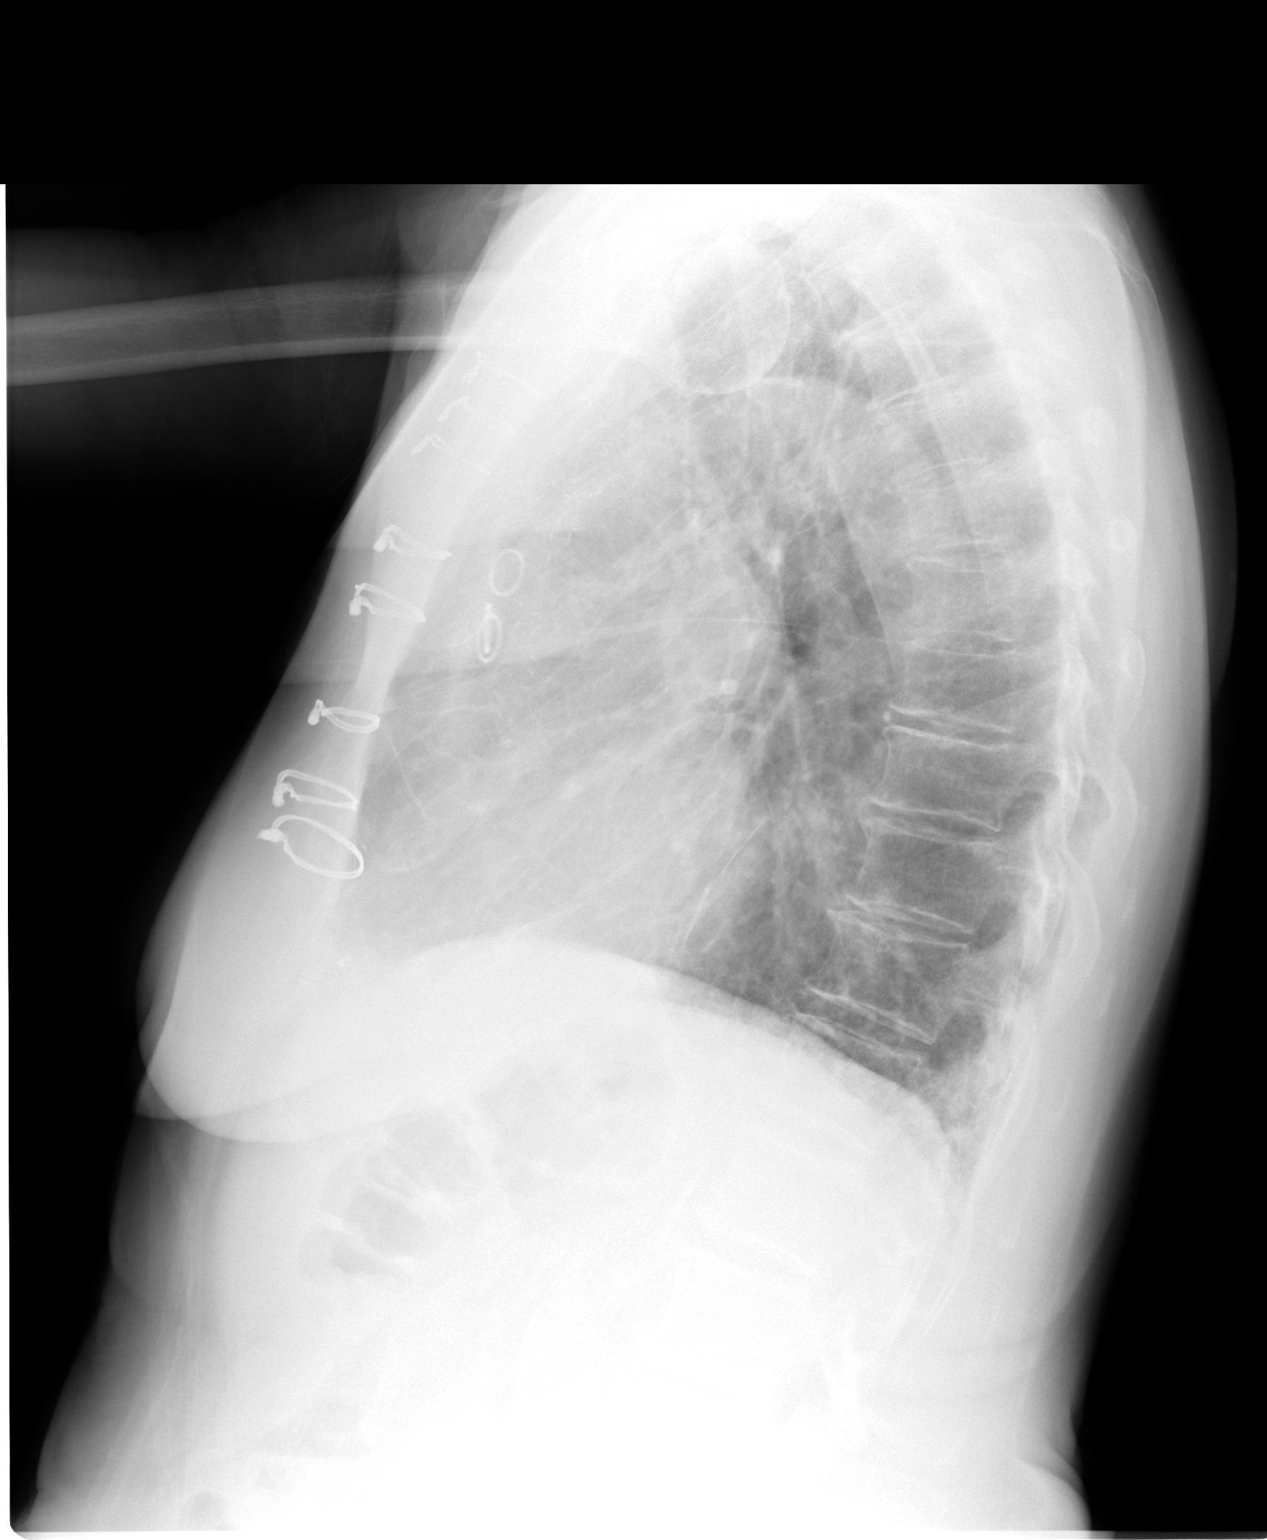

[view not recorded (2 of 2)]
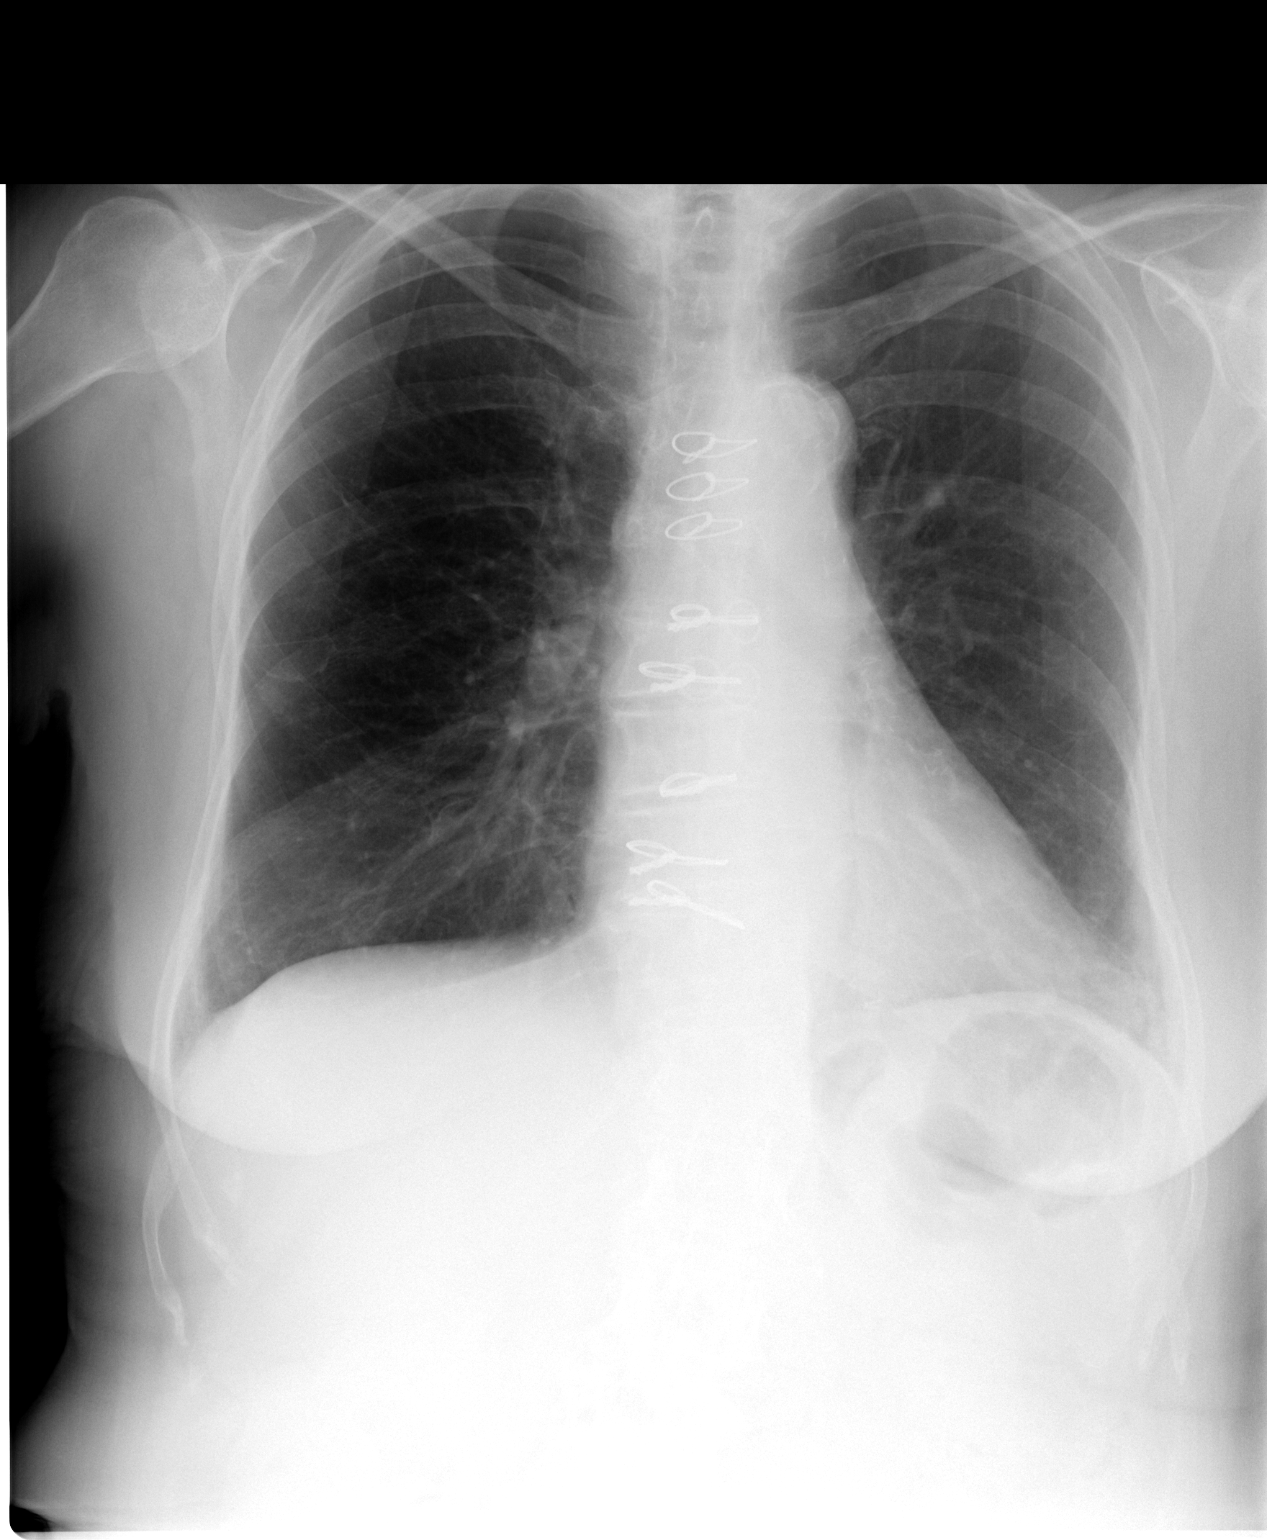

[2 of 2 positions shown; findings below may reference images not displayed]

FINDINGS: Upper normal heart size status post CABG.
Atherosclerotic calcifications aortic arch.
Normal mediastinal contours and pulmonary vascularity.
No pulmonary infiltrate, pleural effusion, or pneumothorax.
Mild diffuse bony demineralization.
IMPRESSION: Prior CABG.
No acute abnormalities.

## 2012-03-27 ENCOUNTER — Other Ambulatory Visit (HOSPITAL_COMMUNITY): Payer: Self-pay | Admitting: *Deleted

## 2012-03-27 DIAGNOSIS — I10 Essential (primary) hypertension: Secondary | ICD-10-CM

## 2012-04-15 ENCOUNTER — Encounter (HOSPITAL_COMMUNITY): Payer: Medicare Other

## 2012-05-16 ENCOUNTER — Ambulatory Visit: Payer: Medicare Other | Admitting: Neurosurgery

## 2012-05-16 ENCOUNTER — Other Ambulatory Visit: Payer: Medicare Other

## 2012-05-16 ENCOUNTER — Encounter (HOSPITAL_COMMUNITY): Payer: Medicare Other

## 2012-06-10 ENCOUNTER — Other Ambulatory Visit (HOSPITAL_COMMUNITY): Payer: Self-pay | Admitting: *Deleted

## 2012-06-10 DIAGNOSIS — I6529 Occlusion and stenosis of unspecified carotid artery: Secondary | ICD-10-CM

## 2012-06-13 ENCOUNTER — Emergency Department (HOSPITAL_COMMUNITY): Payer: Medicare PPO

## 2012-06-13 ENCOUNTER — Encounter (HOSPITAL_COMMUNITY): Payer: Self-pay | Admitting: *Deleted

## 2012-06-13 ENCOUNTER — Emergency Department (HOSPITAL_COMMUNITY)
Admission: EM | Admit: 2012-06-13 | Discharge: 2012-06-13 | Disposition: A | Discharge status: Home / Self Care | Payer: Medicare PPO | Attending: Emergency Medicine | Admitting: Emergency Medicine

## 2012-06-13 DIAGNOSIS — S0083XA Contusion of other part of head, initial encounter: Secondary | ICD-10-CM

## 2012-06-13 DIAGNOSIS — Y9389 Activity, other specified: Secondary | ICD-10-CM | POA: Insufficient documentation

## 2012-06-13 DIAGNOSIS — W010XXA Fall on same level from slipping, tripping and stumbling without subsequent striking against object, initial encounter: Secondary | ICD-10-CM | POA: Insufficient documentation

## 2012-06-13 DIAGNOSIS — M129 Arthropathy, unspecified: Secondary | ICD-10-CM | POA: Insufficient documentation

## 2012-06-13 DIAGNOSIS — S46909A Unspecified injury of unspecified muscle, fascia and tendon at shoulder and upper arm level, unspecified arm, initial encounter: Secondary | ICD-10-CM | POA: Insufficient documentation

## 2012-06-13 DIAGNOSIS — S0180XA Unspecified open wound of other part of head, initial encounter: Secondary | ICD-10-CM | POA: Insufficient documentation

## 2012-06-13 DIAGNOSIS — E119 Type 2 diabetes mellitus without complications: Secondary | ICD-10-CM | POA: Insufficient documentation

## 2012-06-13 DIAGNOSIS — I2510 Atherosclerotic heart disease of native coronary artery without angina pectoris: Secondary | ICD-10-CM | POA: Insufficient documentation

## 2012-06-13 DIAGNOSIS — W19XXXA Unspecified fall, initial encounter: Secondary | ICD-10-CM

## 2012-06-13 DIAGNOSIS — S0003XA Contusion of scalp, initial encounter: Secondary | ICD-10-CM | POA: Insufficient documentation

## 2012-06-13 DIAGNOSIS — S1093XA Contusion of unspecified part of neck, initial encounter: Secondary | ICD-10-CM | POA: Insufficient documentation

## 2012-06-13 DIAGNOSIS — Z79899 Other long term (current) drug therapy: Secondary | ICD-10-CM | POA: Insufficient documentation

## 2012-06-13 DIAGNOSIS — Z951 Presence of aortocoronary bypass graft: Secondary | ICD-10-CM | POA: Insufficient documentation

## 2012-06-13 DIAGNOSIS — Z8673 Personal history of transient ischemic attack (TIA), and cerebral infarction without residual deficits: Secondary | ICD-10-CM | POA: Insufficient documentation

## 2012-06-13 DIAGNOSIS — S4980XA Other specified injuries of shoulder and upper arm, unspecified arm, initial encounter: Secondary | ICD-10-CM | POA: Insufficient documentation

## 2012-06-13 DIAGNOSIS — M25519 Pain in unspecified shoulder: Secondary | ICD-10-CM

## 2012-06-13 DIAGNOSIS — I10 Essential (primary) hypertension: Secondary | ICD-10-CM | POA: Insufficient documentation

## 2012-06-13 DIAGNOSIS — Y92009 Unspecified place in unspecified non-institutional (private) residence as the place of occurrence of the external cause: Secondary | ICD-10-CM | POA: Insufficient documentation

## 2012-06-13 DIAGNOSIS — Z7982 Long term (current) use of aspirin: Secondary | ICD-10-CM | POA: Insufficient documentation

## 2012-06-13 DIAGNOSIS — E785 Hyperlipidemia, unspecified: Secondary | ICD-10-CM | POA: Insufficient documentation

## 2012-06-13 DIAGNOSIS — W108XXA Fall (on) (from) other stairs and steps, initial encounter: Secondary | ICD-10-CM | POA: Insufficient documentation

## 2012-06-13 DIAGNOSIS — S0181XA Laceration without foreign body of other part of head, initial encounter: Secondary | ICD-10-CM

## 2012-06-13 DIAGNOSIS — S8000XA Contusion of unspecified knee, initial encounter: Secondary | ICD-10-CM | POA: Insufficient documentation

## 2012-06-13 DIAGNOSIS — Z7902 Long term (current) use of antithrombotics/antiplatelets: Secondary | ICD-10-CM | POA: Insufficient documentation

## 2012-06-13 MED ORDER — HYDROCODONE-ACETAMINOPHEN 5-325 MG PO TABS: 1.0000 | ORAL_TABLET | Freq: Four times a day (QID) | ORAL | Status: AC | PRN

## 2012-06-13 MED ORDER — HYDROCODONE-ACETAMINOPHEN 5-325 MG PO TABS
1.0000 | ORAL_TABLET | Freq: Once | ORAL | Status: AC
  Administered 2012-06-13: 1 via ORAL
  Filled 2012-06-13: qty 1

## 2012-06-13 NOTE — ED Provider Notes (Addendum)
History     CSN: [id number]  Arrival date & time 06/13/12  1311   First MD Initiated Contact with Patient 06/13/12 1431      Chief Complaint  Patient presents with  . Fall    (Consider location/radiation/quality/duration/timing/severity/associated sxs/prior treatment) Patient is a 79 y.o. female presenting with fall. The history is provided by the patient and the spouse.  Fall Associated symptoms include headaches. Pertinent negatives include no fever, no numbness, no abdominal pain, no nausea and no vomiting.  pt s/p fall at home this morning. States was carrying laundry basket into basement, when stumbled/lost balance on steps. Hit head. Dazed. Forehead contusion, lac and frontal headaches since. Takes plavix, no other anticoag use. Also c/o left shoulder and left knee pain. Constant, dull, moderate, non radiating. Has been ambulatory post fall. Denies neck/back pain. No numbness/weakness. Recent health at baseline. Denies any faintness or lightheadedness prior to fall, states was mechanical fall. Tetanus 2 yrs ago.     Past Medical History  Diagnosis Date  . Stroke   . Arthritis   . Diabetes mellitus   . Hypertension   . CAD (coronary artery disease)   . Hyperlipidemia     Past Surgical History  Procedure Date  . Abdominal hysterectomy   . Coronary artery bypass graft 10/07/2008    by Dr. Maren Beach    History reviewed. No pertinent family history.  History  Substance Use Topics  . Smoking status: Never Smoker   . Smokeless tobacco: Not on file  . Alcohol Use: No    OB History    Grav Para Term Preterm Abortions TAB SAB Ect Mult Living                  Review of Systems  Constitutional: Negative for fever and chills.  HENT: Negative for neck pain.   Eyes: Negative for pain and visual disturbance.  Respiratory: Negative for shortness of breath.   Cardiovascular: Negative for chest pain and palpitations.  Gastrointestinal: Negative for nausea, vomiting and  abdominal pain.  Genitourinary: Negative for flank pain.  Musculoskeletal: Negative for back pain and gait problem.  Skin: Positive for wound.  Neurological: Positive for headaches. Negative for weakness and numbness.  Hematological: Does not bruise/bleed easily.  Psychiatric/Behavioral: Negative for confusion.    Allergies  Codeine and Demerol  Home Medications   Current Outpatient Rx  Name  Route  Sig  Dispense  Refill  . ALPRAZOLAM 1 MG PO TABS   Oral   Take 1 mg by mouth 3 (three) times daily as needed. Anxiety         . ASPIRIN 81 MG PO TBEC   Oral   Take 81 mg by mouth 2 (two) times daily.          . TRAMADOL HCL 50 MG PO TABS   Oral   Take 50 mg by mouth every 8 (eight) hours as needed. Pain         . AMLODIPINE BESY-BENAZEPRIL HCL 5-10 MG PO CAPS   Oral   Take 1 capsule by mouth daily.           . ATORVASTATIN CALCIUM 20 MG PO TABS   Oral   Take 20 mg by mouth daily.           Marland Kitchen CLOPIDOGREL BISULFATE 75 MG PO TABS   Oral   Take 75 mg by mouth daily.           . CYCLOBENZAPRINE HCL 10 MG  PO TABS   Oral   Take 10 mg by mouth daily as needed.           Marland Kitchen LEVOTHYROXINE SODIUM 100 MCG PO TABS   Oral   Take 100 mcg by mouth daily.          Marland Kitchen METFORMIN HCL PO   Oral   Take 500 mg by mouth 2 (two) times daily.           Marland Kitchen METOPROLOL SUCCINATE ER 25 MG PO TB24   Oral   Take 25 mg by mouth daily.           Marland Kitchen METOPROLOL TARTRATE 25 MG PO TABS   Oral   Take 25 mg by mouth 2 (two) times daily.           Marland Kitchen OLMESARTAN MEDOXOMIL-HCTZ 20-12.5 MG PO TABS   Oral   Take 1 tablet by mouth daily.           Marland Kitchen PANTOPRAZOLE SODIUM 40 MG PO TBEC   Oral   Take 40 mg by mouth daily.           Marland Kitchen ZOLPIDEM TARTRATE 10 MG PO TABS   Oral   Take 10 mg by mouth at bedtime as needed.             BP 171/60  Pulse 60  Temp 98.4 F (36.9 C) (Oral)  Resp 20  Wt 120 lb (54.432 kg)  SpO2 99%  Physical Exam  Nursing note and vitals  reviewed. Constitutional: She is oriented to person, place, and time. She appears well-developed and well-nourished. No distress.  HENT:       Large contusion left forehead. 1  Cm superficial lac to left forehead. No fb seen or felt.     Eyes: Conjunctivae normal and EOM are normal. Pupils are equal, round, and reactive to light. No scleral icterus.  Neck: Normal range of motion. Neck supple. No tracheal deviation present.  Cardiovascular: Normal rate, regular rhythm, normal heart sounds and intact distal pulses.   Pulmonary/Chest: Effort normal and breath sounds normal. No respiratory distress. She exhibits no tenderness.  Abdominal: Soft. Normal appearance. She exhibits no distension. There is no tenderness.       No abd wall contusion or bruising.   Musculoskeletal: She exhibits no edema.       CTLS spine, non tender, aligned, no step off. Tenderness left shoulder and left knee. Good rom. Knee stable. Distal pulses palp. No other focal bony tenderness noted on bil ext exam.   Neurological: She is alert and oriented to person, place, and time.       Motor intact bil. Steady gait.   Skin: Skin is warm and dry. No rash noted. She is not diaphoretic.  Psychiatric: She has a normal mood and affect.    ED Course  Procedures (including critical care time)  Results for orders placed during the hospital encounter of 02/25/09  POCT I-STAT, CHEM 8      Component Value Range   Sodium 141  135 - 145 mEq/L   Potassium 3.8  3.5 - 5.1 mEq/L   Chloride 102  96 - 112 mEq/L   BUN 15  6 - 23 mg/dL   Creatinine, Ser 0.7  0.4 - 1.2 mg/dL   Glucose, Bld 92  70 - 99 mg/dL   Calcium, Ion 4.09  8.11 - 1.32 mmol/L   TCO2 31  0 - 100 mmol/L   Hemoglobin 13.3  12.0 -  15.0 g/dL   HCT 16.1  09.6 - 04.5 %  GLUCOSE, CAPILLARY      Component Value Range   Glucose-Capillary 86  70 - 99 mg/dL   Ct Head Wo Contrast  06/13/2012  *RADIOLOGY REPORT*  Clinical Data: Pain.  Fall.  Contusion and laceration to the  left forehead.  CT HEAD WITHOUT CONTRAST  Technique:  Contiguous axial images were obtained from the base of the skull through the vertex without contrast.  Comparison: MRI brain 02/08/2009.  Findings: A left frontal scalp hematoma and laceration are noted. There is no underlying fracture.  The paranasal sinuses and mastoid air cells are clear.  Atherosclerotic calcifications are present in the cavernous carotid arteries.  No acute cortical infarct, hemorrhage, or mass lesion is present. A remote lacunar infarct is again noted within the right basal ganglia.  Left MCA territory encephalomalacia is compatible with previously documented infarct.  There is also evidence for a previous anterior left frontal lobe infarcts, not present on the prior study.  The ventricles are proportionate to the degree of atrophy.  No significant extra-axial fluid collection is present.  IMPRESSION:  1.  No acute intracranial abnormality. 2.  Left frontal scalp hematoma without an underlying fracture. 3.  Remote lacunar infarct of the right basal ganglia. 4.  Remote scattered areas of remote encephalomalacia in the left MCA territory.   Original Report Authenticated By: Marin Roberts, M.D.    Dg Shoulder Left  06/13/2012  *RADIOLOGY REPORT*  Clinical Data: Shoulder pain post fall  LEFT SHOULDER - 2+ VIEW  Comparison: None.  Findings: Three views of the left shoulder submitted.  No acute fracture or subluxation.  Mild degenerative changes glenohumeral joint.  Mild inferior spurring of the acromion.  IMPRESSION: No acute fracture or subluxation.  Mild degenerative changes.   Original Report Authenticated By: Natasha Mead, M.D.    Dg Knee Complete 4 Views Left  06/13/2012  *RADIOLOGY REPORT*  Clinical Data: Pain post fall  LEFT KNEE - COMPLETE 4+ VIEW  Comparison: None.  Findings: Four views of the left knee submitted.  No acute fracture or subluxation.  Small joint effusion.  Mild narrowing of medial joint compartment.  Mild  spurring of medial tibial plateau.  IMPRESSION: No acute fracture or subluxation.  Small joint effusion.  Mild degenerative changes.   Original Report Authenticated By: Natasha Mead, M.D.       MDM  Iv ns. Ct. Xrays.  Reviewed nursing notes and prior charts for additional history.    Initially pt declines any pain med.  On recheck states for pain in knee/shoulder, would like pain med. Hydrocodone po.  LACERATION REPAIR Performed by: Suzi Roots Authorized by: Suzi Roots Consent: Verbal consent obtained. Risks and benefits: risks, benefits and alternatives were discussed Consent given by: patient Patient identity confirmed: provided demographic data Prepped and Draped in normal sterile fashion Wound explored  Laceration Location: forehead  Laceration Length: 1cm  No Foreign Bodies seen or palpated  Anesthesia: local infiltration  Local anesthetic: none  Anesthetic total: 0 ml  Irrigation method: syringe Amount of cleaning: standard  Skin closure: dermabond  Number of sutures: n/a  Technique: dermabond  Patient tolerance: Patient tolerated the procedure well with no immediate complications.     Recheck spine nt.   Pt requests pain rx for home, states no longer has the ultram.    Suzi Roots, MD 06/13/12 1555  Suzi Roots, MD 06/13/12 [phone]

## 2012-06-13 NOTE — ED Notes (Signed)
Fell down app 5 steps 10 am, when going to basement .  No LOC, contusion, lac to lt forehead.  Lt knee is "sore",  Neck is "sore".  Ambulatory to triage. Without problem.  Takes aspirin

## 2012-06-13 NOTE — ED Notes (Signed)
Dr. Denton Lank at bedside for suturing,

## 2012-06-13 NOTE — ED Notes (Signed)
Pt gone to X-Ray 

## 2012-06-13 NOTE — ED Notes (Signed)
c-collar placed on pt in triage. [name] When undressing pt at this time I noticed that pt has bruising to left flank pt states only painful to touch pt was unaware of area until she undressed. [name]

## 2012-06-13 NOTE — ED Notes (Addendum)
Pt was coming down her basement steps carrying a basket of wet clothes when she lost her balance and fell rolling down 5 steps landing at the bottom of the steps, denies any loc, pt c/o pain to neck, left arm, left knee, left hip, bruising noted to left flank and forehead area.  laceration to left forehead area, bleeding controlled at presentPt alert able to answer all questions, cms intact all extremities.

## 2012-06-18 ENCOUNTER — Ambulatory Visit (HOSPITAL_COMMUNITY)
Admission: RE | Admit: 2012-06-18 | Discharge: 2012-06-18 | Disposition: A | Discharge status: Home / Self Care | Payer: Medicare PPO | Source: Ambulatory Visit | Attending: Cardiovascular Disease | Admitting: Cardiovascular Disease

## 2012-06-18 DIAGNOSIS — E119 Type 2 diabetes mellitus without complications: Secondary | ICD-10-CM | POA: Insufficient documentation

## 2012-06-18 DIAGNOSIS — I658 Occlusion and stenosis of other precerebral arteries: Secondary | ICD-10-CM | POA: Insufficient documentation

## 2012-06-18 DIAGNOSIS — I359 Nonrheumatic aortic valve disorder, unspecified: Secondary | ICD-10-CM | POA: Insufficient documentation

## 2012-06-18 DIAGNOSIS — I10 Essential (primary) hypertension: Secondary | ICD-10-CM | POA: Insufficient documentation

## 2012-06-18 DIAGNOSIS — I6529 Occlusion and stenosis of unspecified carotid artery: Secondary | ICD-10-CM | POA: Insufficient documentation

## 2012-06-18 NOTE — Progress Notes (Signed)
*  PRELIMINARY RESULTS* Echocardiogram 2D Echocardiogram has been performed.  Juaquina Machnik Jane 06/18/2012, 2:14 PM 

## 2012-11-26 ENCOUNTER — Telehealth (HOSPITAL_COMMUNITY): Payer: Self-pay | Admitting: Cardiovascular Disease

## 2012-12-18 ENCOUNTER — Telehealth (HOSPITAL_COMMUNITY): Payer: Self-pay | Admitting: Cardiovascular Disease

## 2012-12-18 NOTE — Telephone Encounter (Signed)
   CARDIOVASCULAR IMAGING LOCATED AT [facility] [address] [place], [place] [address] [phone]   Date:12/18/2012  Dear Dr. Alanda Amass Our office has attempted to contact your patient [facility]  twice by telephone and we have also sent an appointment letter to schedule the Renal Doppler test you ordered. The patient has not responded. We will not make any further attempts to contact the patient. If any further assistance is needed for this referral, please contact our office at [phone]  Sincerely, [facility] Cardiovascular Imaging Scheduling Team

## 2013-01-02 ENCOUNTER — Other Ambulatory Visit: Payer: Self-pay | Admitting: *Deleted

## 2013-01-02 DIAGNOSIS — R0989 Other specified symptoms and signs involving the circulatory and respiratory systems: Secondary | ICD-10-CM

## 2013-01-05 ENCOUNTER — Other Ambulatory Visit: Payer: Self-pay | Admitting: Cardiovascular Disease

## 2013-01-05 LAB — CBC WITH DIFFERENTIAL/PLATELET
Basophils Relative: 0 % (ref 0–1)
Eosinophils Absolute: 0.4 10*3/uL (ref 0.0–0.7)
HCT: 40.6 % (ref 36.0–46.0)
Hemoglobin: 13.7 g/dL (ref 12.0–15.0)
Lymphs Abs: 1.5 10*3/uL (ref 0.7–4.0)
MCH: 30.3 pg (ref 26.0–34.0)
MCHC: 33.7 g/dL (ref 30.0–36.0)
Monocytes Absolute: 0.5 10*3/uL (ref 0.1–1.0)
Monocytes Relative: 9 % (ref 3–12)
Neutro Abs: 3 10*3/uL (ref 1.7–7.7)
RBC: 4.52 MIL/uL (ref 3.87–5.11)

## 2013-01-05 LAB — COMPREHENSIVE METABOLIC PANEL
Albumin: 4 g/dL (ref 3.5–5.2)
Alkaline Phosphatase: 68 U/L (ref 39–117)
BUN: 23 mg/dL (ref 6–23)
CO2: 32 mEq/L (ref 19–32)
Calcium: 9.6 mg/dL (ref 8.4–10.5)
Glucose, Bld: 99 mg/dL (ref 70–99)
Potassium: 4.5 mEq/L (ref 3.5–5.3)
Total Protein: 7 g/dL (ref 6.0–8.3)

## 2013-01-05 LAB — LIPID PANEL
Cholesterol: 267 mg/dL — ABNORMAL HIGH (ref 0–200)
HDL: 45 mg/dL (ref >39)
LDL Cholesterol: 157 mg/dL — ABNORMAL HIGH (ref 0–99)
Triglycerides: 327 mg/dL — ABNORMAL HIGH (ref <150)

## 2013-01-20 ENCOUNTER — Encounter: Payer: Self-pay | Admitting: Cardiovascular Disease

## 2013-01-27 ENCOUNTER — Ambulatory Visit (HOSPITAL_COMMUNITY)
Admission: RE | Admit: 2013-01-27 | Discharge: 2013-01-27 | Disposition: A | Discharge status: Home / Self Care | Payer: Medicare PPO | Source: Ambulatory Visit | Attending: Cardiovascular Disease | Admitting: Cardiovascular Disease

## 2013-01-27 DIAGNOSIS — R0989 Other specified symptoms and signs involving the circulatory and respiratory systems: Secondary | ICD-10-CM | POA: Insufficient documentation

## 2013-01-27 NOTE — Progress Notes (Signed)
Carotid Duplex Completed. °Brianna L Mazza,RVT °

## 2013-04-20 ENCOUNTER — Other Ambulatory Visit: Payer: Self-pay

## 2013-04-20 MED ORDER — CLOPIDOGREL BISULFATE 75 MG PO TABS: 75.0000 mg | ORAL_TABLET | Freq: Every day | ORAL | Status: DC

## 2013-04-20 NOTE — Telephone Encounter (Signed)
Prescription for Clopidogrel sent to pharmacy electronically.   Tramadol routed to Dr Tresa Endo approval.

## 2013-04-23 NOTE — Telephone Encounter (Signed)
LM at pharmacy for pt to get tramadol from PCP.  Dr. Tresa Endo does not prescribe

## 2013-05-20 ENCOUNTER — Ambulatory Visit: Payer: Medicare PPO | Admitting: Cardiovascular Disease

## 2013-05-21 ENCOUNTER — Ambulatory Visit: Payer: Medicare PPO | Admitting: Cardiovascular Disease

## 2013-07-14 ENCOUNTER — Other Ambulatory Visit: Payer: Self-pay

## 2013-07-14 MED ORDER — ATORVASTATIN CALCIUM 20 MG PO TABS: 20.0000 mg | ORAL_TABLET | Freq: Every day | ORAL | Status: DC

## 2013-07-14 NOTE — Telephone Encounter (Signed)
Rx was sent to pharmacy electronically. 

## 2013-07-30 NOTE — Telephone Encounter (Signed)
Shelly  Refill this for another month and Dr. Tresa EndoKelly can address it at appt  [facility]

## 2013-08-04 ENCOUNTER — Other Ambulatory Visit: Payer: Self-pay

## 2013-08-04 MED ORDER — METOPROLOL TARTRATE 50 MG PO TABS
50.0000 mg | ORAL_TABLET | Freq: Every day | ORAL | Status: DC
Start: 2013-08-04 — End: 2014-02-12

## 2013-08-04 MED ORDER — OLMESARTAN MEDOXOMIL 20 MG PO TABS: 20.0000 mg | ORAL_TABLET | Freq: Every day | ORAL | Status: DC

## 2013-08-04 NOTE — Telephone Encounter (Signed)
Rx was sent to pharmacy electronically. 

## 2013-08-04 NOTE — Addendum Note (Signed)
Addended by: Neta EhlersRUITT, Lorene Samaan M on: 08/04/2013 03:12 PM   Modules accepted: Orders

## 2013-08-12 ENCOUNTER — Ambulatory Visit (INDEPENDENT_AMBULATORY_CARE_PROVIDER_SITE_OTHER): Payer: Medicare PPO | Admitting: Cardiovascular Disease

## 2013-08-12 ENCOUNTER — Encounter: Payer: Self-pay | Admitting: Cardiovascular Disease

## 2013-08-12 VITALS — BP 136/72 | HR 52 | Ht 64.0 in | Wt 130.7 lb

## 2013-08-12 DIAGNOSIS — E119 Type 2 diabetes mellitus without complications: Secondary | ICD-10-CM

## 2013-08-12 DIAGNOSIS — I2510 Atherosclerotic heart disease of native coronary artery without angina pectoris: Secondary | ICD-10-CM

## 2013-08-12 DIAGNOSIS — I350 Nonrheumatic aortic (valve) stenosis: Secondary | ICD-10-CM

## 2013-08-12 DIAGNOSIS — R5383 Other fatigue: Secondary | ICD-10-CM

## 2013-08-12 DIAGNOSIS — R5381 Other malaise: Secondary | ICD-10-CM

## 2013-08-12 DIAGNOSIS — I639 Cerebral infarction, unspecified: Secondary | ICD-10-CM

## 2013-08-12 DIAGNOSIS — E039 Hypothyroidism, unspecified: Secondary | ICD-10-CM

## 2013-08-12 DIAGNOSIS — I359 Nonrheumatic aortic valve disorder, unspecified: Secondary | ICD-10-CM

## 2013-08-12 DIAGNOSIS — E785 Hyperlipidemia, unspecified: Secondary | ICD-10-CM

## 2013-08-12 DIAGNOSIS — I10 Essential (primary) hypertension: Secondary | ICD-10-CM

## 2013-08-12 DIAGNOSIS — E782 Mixed hyperlipidemia: Secondary | ICD-10-CM

## 2013-08-12 DIAGNOSIS — I6350 Cerebral infarction due to unspecified occlusion or stenosis of unspecified cerebral artery: Secondary | ICD-10-CM

## 2013-08-12 MED ORDER — ATORVASTATIN CALCIUM 40 MG PO TABS: 40.0000 mg | ORAL_TABLET | Freq: Every day | ORAL | Status: DC

## 2013-08-12 MED ORDER — EZETIMIBE 10 MG PO TABS: 10.0000 mg | ORAL_TABLET | Freq: Every day | ORAL | Status: AC

## 2013-08-12 NOTE — Patient Instructions (Addendum)
Your physician has requested that you have an echocardiogram. Echocardiography is a painless test that uses sound waves to create images of your heart. It provides your doctor with information about the size and shape of your heart and how well your heart's chambers and valves are working. This procedure takes approximately one  hour. There are no restrictions for this procedure. This [facility]  Your physician recommends that you return for lab work in: 4 weeks.  Your physician has recommended you make the following change in your medication: Increase the atorvastatin to 40 mg daily from 20 mg. Start new prescription for zetia. This has already been sent to the pharmacy.  Your physician recommends that you schedule a follow-up appointment in: 2 months.

## 2013-08-17 ENCOUNTER — Other Ambulatory Visit: Payer: Self-pay

## 2013-08-17 ENCOUNTER — Ambulatory Visit (HOSPITAL_COMMUNITY)
Admission: RE | Admit: 2013-08-17 | Discharge: 2013-08-17 | Disposition: A | Discharge status: Home / Self Care | Payer: Medicare PPO | Source: Ambulatory Visit | Attending: Cardiovascular Disease | Admitting: Cardiovascular Disease

## 2013-08-17 DIAGNOSIS — I359 Nonrheumatic aortic valve disorder, unspecified: Secondary | ICD-10-CM | POA: Insufficient documentation

## 2013-08-17 DIAGNOSIS — I350 Nonrheumatic aortic (valve) stenosis: Secondary | ICD-10-CM

## 2013-08-17 DIAGNOSIS — I10 Essential (primary) hypertension: Secondary | ICD-10-CM | POA: Insufficient documentation

## 2013-08-17 DIAGNOSIS — E119 Type 2 diabetes mellitus without complications: Secondary | ICD-10-CM | POA: Insufficient documentation

## 2013-08-17 DIAGNOSIS — E785 Hyperlipidemia, unspecified: Secondary | ICD-10-CM | POA: Insufficient documentation

## 2013-08-17 DIAGNOSIS — I2510 Atherosclerotic heart disease of native coronary artery without angina pectoris: Secondary | ICD-10-CM | POA: Insufficient documentation

## 2013-08-17 DIAGNOSIS — I369 Nonrheumatic tricuspid valve disorder, unspecified: Secondary | ICD-10-CM

## 2013-08-17 MED ORDER — LEVOTHYROXINE SODIUM 100 MCG PO TABS: 100.0000 ug | ORAL_TABLET | Freq: Every day | ORAL | Status: DC

## 2013-08-17 NOTE — Telephone Encounter (Signed)
Received refill request for Levothyroxine. Patient saw Dr Bishop Limbo Kelly on 08/12/2013. Is Dr Tresa EndoKelly going to want to continue refilling medication - or should I defer to PCP.

## 2013-08-17 NOTE — Progress Notes (Signed)
*  PRELIMINARY RESULTS* Echocardiogram 2D Echocardiogram has been performed.  Hollace Michelli 08/17/2013, 4:16 PM

## 2013-08-30 ENCOUNTER — Encounter: Payer: Self-pay | Admitting: Cardiovascular Disease

## 2013-08-30 DIAGNOSIS — I2510 Atherosclerotic heart disease of native coronary artery without angina pectoris: Secondary | ICD-10-CM | POA: Insufficient documentation

## 2013-08-30 DIAGNOSIS — E785 Hyperlipidemia, unspecified: Secondary | ICD-10-CM | POA: Insufficient documentation

## 2013-08-30 DIAGNOSIS — E039 Hypothyroidism, unspecified: Secondary | ICD-10-CM | POA: Insufficient documentation

## 2013-08-30 DIAGNOSIS — I10 Essential (primary) hypertension: Secondary | ICD-10-CM | POA: Insufficient documentation

## 2013-08-30 DIAGNOSIS — I639 Cerebral infarction, unspecified: Secondary | ICD-10-CM | POA: Insufficient documentation

## 2013-08-30 NOTE — Progress Notes (Signed)
Patient ID: ISMERAI BIN, female   DOB: 13-Jan-1927, 80 y.o.   MRN: [id number]     PATIENT PROFILE: This Jenean Lindau is an 80 year old female patient who presents to establish cardiology care with me today.  She is a former patient of Dr. Rollene Fare who is since retired from his solo practice.   HPI: This [employer] has a history of diabetes mellitus, hypertension, coronary artery disease, peripheral vascular disease with bilateral carotid abnormalities, and previous documentation of mild valvular aortic stenosis.  She is status post CABG x4 revascularization surgery which was done on 10/07/2008 by Dr. Tharon Aquas Tright which was done after an aborted PCI attempt to the LAD by Dr. Einar Gip. She had a LIMA placed to the LAD, saphenous vein graft to the diagonal, saphenous vein to the circumflex and vein to the PDA.  She also has a history of hypothyroidism, hyperlipidemia. In the past, she had issues with thrombocytopenia.  She is followed by Dr. Trula Slade for her bilateral carotid disease.  There is a history of prior left brain CVA.  A review of her chart indicates that she had her last echo in February 2014.  Ejection fraction was 50-55%.  There was moderate hypokinesis of the anteroseptal wall.  There was grade 1 diastolic dysfunction.  There was evidence for mild aortic valve stenosis, mitral annular calcification with mild MR, mild, moderate left atrial dilatation, and mild right atrial dilatation.  Her last nuclear perfusion study was in 2012, which was low risk it showed a small region of intra-apical scar with borderline superimposed ischemia.  Her last laboratory was in August 2014, which showed a BUN of 23, creatinine 1.17.  Normal liver function studies.  Platelet count was normal at 2:15.  There was no anemia.  Her lipids were abnormal with a total cholesterol of 267, triglycerides 327, VLDL 65, and LDL 157.  He has been on atorvastatin 20 mg.  She also has a history of hypothyroidism and  has been on Synthroid 100 mcg daily.  Azalee, she denies recent chest pain.  She denies increasing shortness of breath.  She is unaware of presyncope or syncope.   Past Medical History  Diagnosis Date  . Stroke   . Arthritis   . Diabetes mellitus   . Hypertension   . CAD (coronary artery disease)   . Hyperlipidemia     Past Surgical History  Procedure Laterality Date  . Abdominal hysterectomy    . Coronary artery bypass graft  10/07/2008    by Dr. Darcey Nora    Allergies  Allergen Reactions  . Codeine Nausea And Vomiting  . Demerol Nausea And Vomiting    Current Outpatient Prescriptions  Medication Sig Dispense Refill  . ALPRAZolam (XANAX) 1 MG tablet Take 1 mg by mouth 3 (three) times daily as needed. Anxiety      . amLODipine (NORVASC) 5 MG tablet Take 5 mg by mouth daily.      Marland Kitchen aspirin 81 MG EC tablet Take 81 mg by mouth daily.       . clopidogrel (PLAVIX) 75 MG tablet Take 1 tablet (75 mg total) by mouth daily.  30 tablet  8  . cyclobenzaprine (FLEXERIL) 10 MG tablet Take 10 mg by mouth daily as needed.        . hydrochlorothiazide (MICROZIDE) 12.5 MG capsule Take 12.5 mg by mouth daily.      Marland Kitchen HYDROcodone-acetaminophen (NORCO/VICODIN) 5-325 MG per tablet Take 1 tablet by mouth every 6 (six) hours as needed for  pain.  20 tablet  0  . metoprolol (LOPRESSOR) 50 MG tablet Take 1 tablet (50 mg total) by mouth daily.  30 tablet  4  . olmesartan (BENICAR) 20 MG tablet Take 1 tablet (20 mg total) by mouth daily.  30 tablet  4  . traMADol (ULTRAM) 50 MG tablet Take 50 mg by mouth every 8 (eight) hours as needed. Pain      . atorvastatin (LIPITOR) 40 MG tablet Take 1 tablet (40 mg total) by mouth daily.  30 tablet  6  . ezetimibe (ZETIA) 10 MG tablet Take 1 tablet (10 mg total) by mouth daily.  30 tablet  6  . levothyroxine (SYNTHROID, LEVOTHROID) 100 MCG tablet Take 1 tablet (100 mcg total) by mouth daily.  30 tablet  6   No current facility-administered medications for this  visit.    Socially, she is married for 2 years.  She has 3 children and 2 grandchildren 2 great grandchildren.  She does not routinely exercise.  History reviewed. No pertinent family history.  ROS is negative for fever chills or night sweats.  She denies change in weight.  There is no rash.  There is no change in vision or hearing.  She is unaware of palpitations.  There is no wheezing.  She denies chest tightness.  She denies abdominal pain.  She denies awareness of blood in stool or urine.  Does have documented carotid disease.  She does have aortic valve disease and a cardiac murmur.  There is a history of hyperlipidemia.  She does have hypothyroidism.  She denies consulting sleep.  She denies neurologic symptoms.  Other comprehensive 14 point system review is negative.  PE BP 136/72  Pulse 52  Ht $R'5\' 4"'Xx$  (1.626 m)  Wt 130 lb 11.2 oz (59.285 kg)  BMI 22.42 kg/m2 General: Alert, oriented, no distress.  Skin: normal turgor, no rashes HEENT: Normocephalic, atraumatic. Pupils round and reactive; sclera anicteric; extraocular muscles intact; Fundi ** Nose without nasal septal hypertrophy Mouth/Parynx benign; Mallinpatti scale Neck: No JVD, soft, bilateral carotid bruits; normal carotid upstroke Lungs: clear to ausculatation and percussion; no wheezing or rales Chest wall: without tenderness to palpitation Heart: RRR, s1 s2 normal 2/6 hard systolic murmur in the aortic region; no S3 gallop.  No rubs, thrills or heaves. Abdomen: soft, nontender; no hepatosplenomehaly, BS+; abdominal aorta nontender and not dilated by palpation. Back: no CVA tenderness Pulses 2+ Extremities: no clubbinbg cyanosis or edema, Homan's sign negative  Neurologic: grossly nonfocal; Cranial nerves grossly wnl Psychologic: Normal mood and affect    ECG (independently read by me): Sinus bradycardia 52 beats per minute.  Nonspecific ST changes.  LABS:  BMET    Component Value Date/Time   NA 138 01/05/2013 0910    K 4.5 01/05/2013 0910   CL 100 01/05/2013 0910   CO2 32 01/05/2013 0910   GLUCOSE 99 01/05/2013 0910   BUN 23 01/05/2013 0910   CREATININE 1.17* 01/05/2013 0910   CREATININE 0.7 02/25/2009 0616   CALCIUM 9.6 01/05/2013 0910   GFRNONAA 53* 02/04/2009 1440   GFRAA  Value: >60        The eGFR has been calculated using the MDRD equation. This calculation has not been validated in all clinical situations. eGFR's persistently <60 mL/min signify possible Chronic Kidney Disease. 02/04/2009 1440     Hepatic Function Panel     Component Value Date/Time   PROT 7.0 01/05/2013 0910   ALBUMIN 4.0 01/05/2013 0910   AST 14 01/05/2013  0910   ALT 9 01/05/2013 0910   ALKPHOS 68 01/05/2013 0910   BILITOT 0.5 01/05/2013 0910     CBC    Component Value Date/Time   WBC 5.4 01/05/2013 0910   RBC 4.52 01/05/2013 0910   RBC 3.93 10/10/2008 1945   HGB 13.7 01/05/2013 0910   HCT 40.6 01/05/2013 0910   PLT 215 01/05/2013 0910   MCV 89.8 01/05/2013 0910   MCH 30.3 01/05/2013 0910   MCHC 33.7 01/05/2013 0910   RDW 15.3 01/05/2013 0910   LYMPHSABS 1.5 01/05/2013 0910   MONOABS 0.5 01/05/2013 0910   EOSABS 0.4 01/05/2013 0910   BASOSABS 0.0 01/05/2013 0910     BNP    Component Value Date/Time   PROBNP 796.0* 10/19/2008 0355    Lipid Panel     Component Value Date/Time   CHOL 267* 01/05/2013 0910   TRIG 327* 01/05/2013 0910   HDL 45 01/05/2013 0910   CHOLHDL 5.9 01/05/2013 0910   VLDL 65* 01/05/2013 0910   LDLCALC 157* 01/05/2013 0910     RADIOLOGY: No results found.   ASSESSMENT AND PLAN: Ms. Dahiana Kulak is an 80 year old female who underwent CABG revascularization surgery following an aborted attempt at intervention to her LAD in May 2010.  There is also a history of a prior left brain CVA.  She has documented bilateral carotid disease and is followed by vascular surgery.  Her lipids are markedly abnormal.  I recommended she increase her atorvastatin 40 mg a day, Zetia 10 mg and attempt to be more  aggressive, which hopefully will allow for plaque regression.  I am recommending repeat laboratory be obtained in 4 weeks.  I will schedule her for a followup echo Doppler study to reassess her aortic valve, as well as systolic and diastolic function.  I'll see her back in the office in 2 months for cardiology reevaluation.   Troy Sine, MD, [facility] 08/30/2013 1:50 PM

## 2013-09-15 ENCOUNTER — Encounter: Payer: Self-pay | Admitting: Cardiovascular Disease

## 2013-09-15 ENCOUNTER — Ambulatory Visit (INDEPENDENT_AMBULATORY_CARE_PROVIDER_SITE_OTHER): Payer: Medicare PPO | Admitting: Cardiovascular Disease

## 2013-09-15 VITALS — BP 154/76 | Ht 64.0 in | Wt 133.5 lb

## 2013-09-15 DIAGNOSIS — I10 Essential (primary) hypertension: Secondary | ICD-10-CM

## 2013-09-15 DIAGNOSIS — E785 Hyperlipidemia, unspecified: Secondary | ICD-10-CM

## 2013-09-15 DIAGNOSIS — I6529 Occlusion and stenosis of unspecified carotid artery: Secondary | ICD-10-CM

## 2013-09-15 DIAGNOSIS — I2510 Atherosclerotic heart disease of native coronary artery without angina pectoris: Secondary | ICD-10-CM

## 2013-09-15 DIAGNOSIS — E039 Hypothyroidism, unspecified: Secondary | ICD-10-CM

## 2013-09-15 NOTE — Patient Instructions (Signed)
Your physician recommends that you return for lab work a few days to a week - you will need to be fasting (nothing to eat/drink after midnight)  Your physician recommends that you schedule a follow-up appointment in: 4 months with Dr. Tresa EndoKelly

## 2013-09-26 ENCOUNTER — Encounter: Payer: Self-pay | Admitting: Cardiovascular Disease

## 2013-09-26 NOTE — Progress Notes (Signed)
Patient ID: TAKEYA MARQUIS, female   DOB: [birth year], 80 y.o.   MRN: [id number]     HPI:  Ms Jenean Lindau is an 80 year old female patient who presents for followup evaluation.  I had seen her approximately 6 weeks ago, when she establish cardiology care with me.  She is a former patient of Dr. Rollene Fare.  Ms Jenean Lindau has a history of diabetes mellitus, hypertension, coronary artery disease, peripheral vascular disease with bilateral carotid abnormalities, and mild valvular aortic stenosis.  She is status post CABG x4 revascularization surgery on 10/07/2008 by Dr. Tharon Aquas Tright after an aborted PCI attempt to the LAD by Dr. Einar Gip. She had a LIMA placed to the LAD, saphenous vein graft to the diagonal, saphenous vein to the circumflex and vein to the PDA.  She also has a history of hypothyroidism, hyperlipidemia. Remotely, she had issues with thrombocytopenia.  She is followed by Dr. Trula Slade for her bilateral carotid disease and she has a history of prior left brain CVA.  In  February 2014 and echo revealed an ejection fraction was 50-55% with  moderate hypokinesis of the anteroseptal wall, grade 1 diastolic dysfunction, mild aortic valve stenosis, mitral annular calcification with mild MR, mild, moderate left atrial dilatation, and mild right atrial dilatation.  Her last nuclear perfusion study  in 2012, which was low risk it showed a small region of intra-apical scar with borderline superimposed ischemia.  In August 2014 laboratory revealed a BUN of 23, creatinine 1.17.  Normal liver function studies.  Platelet count was normal at 2:15.  There was no anemia.  Her lipids were abnormal with a total cholesterol of 267, triglycerides 327, VLDL 65, and LDL 157 on atorvastatin 20 mg.  She also has a history of hypothyroidism and has been on Synthroid 100 mcg daily.  When I saw her, I recommended that she increase her atorvastatin 40 mg a day and add Zetia 10 mg in an attempt to be more aggressive and  allow for plaque regression.  I have recommended subsequent followup laboratory.  She also underwent a followup echo Doppler study to reassess her aortic valve.  This was done on 08/17/2013 and showed an ejection fraction of 55-60%.  There were no regional abnormalities.  She again had grade 1 diastolic dysfunction.  The aortic valve had moderate calcification.  Morphologically the valve appeared to be severely stenotic however, by the continuity and valve gradients.  There was only felt to be moderate aortic stenosis.  Mean gradient was 16 and peak gradient 22, with estimated valve area 1.1 cm.  She did have mild-to-moderate tricuspid regurgitation, and moderately severe mitral annular calcification.  She presents now for followup evaluation  Azalee, she denies recent chest pain.  She denies increasing shortness of breath.  She is unaware of presyncope or syncope.   Past Medical History  Diagnosis Date  . Stroke   . Arthritis   . Diabetes mellitus   . Hypertension   . CAD (coronary artery disease)   . Hyperlipidemia     Past Surgical History  Procedure Laterality Date  . Abdominal hysterectomy    . Coronary artery bypass graft  10/07/2008    by Dr. Darcey Nora    Allergies  Allergen Reactions  . Codeine Nausea And Vomiting  . Demerol Nausea And Vomiting    Current Outpatient Prescriptions  Medication Sig Dispense Refill  . ALPRAZolam (XANAX) 1 MG tablet Take 1 mg by mouth 3 (three) times daily as needed. Anxiety      .  amLODipine (NORVASC) 5 MG tablet Take 5 mg by mouth daily.      Marland Kitchen aspirin 81 MG EC tablet Take 81 mg by mouth daily.       Marland Kitchen atorvastatin (LIPITOR) 40 MG tablet Take 1 tablet (40 mg total) by mouth daily.  30 tablet  6  . clopidogrel (PLAVIX) 75 MG tablet Take 1 tablet (75 mg total) by mouth daily.  30 tablet  8  . cyclobenzaprine (FLEXERIL) 10 MG tablet Take 10 mg by mouth daily as needed.        . ezetimibe (ZETIA) 10 MG tablet Take 1 tablet (10 mg total) by mouth  daily.  30 tablet  6  . hydrochlorothiazide (MICROZIDE) 12.5 MG capsule Take 12.5 mg by mouth daily.      Marland Kitchen HYDROcodone-acetaminophen (NORCO/VICODIN) 5-325 MG per tablet Take 1 tablet by mouth every 6 (six) hours as needed for pain.  20 tablet  0  . levothyroxine (SYNTHROID, LEVOTHROID) 100 MCG tablet Take 1 tablet (100 mcg total) by mouth daily.  30 tablet  6  . metoprolol (LOPRESSOR) 50 MG tablet Take 1 tablet (50 mg total) by mouth daily.  30 tablet  4  . olmesartan (BENICAR) 20 MG tablet Take 1 tablet (20 mg total) by mouth daily.  30 tablet  4  . traMADol (ULTRAM) 50 MG tablet Take 50 mg by mouth every 8 (eight) hours as needed. Pain       No current facility-administered medications for this visit.    Socially, she is married for 67 years.  She has 3 children and 2 grandchildren 2 great grandchildren.  She does not routinely exercise.  History reviewed. No pertinent family history.  ROS General: Negative; No fevers, chills, or night sweats;  HEENT: Negative; No changes in vision or hearing, sinus congestion, difficulty swallowing Pulmonary: Negative; No cough, wheezing, shortness of breath, hemoptysis Cardiovascular: See HPI; No chest pain, presyncope, syncope, palpatations GI: Negative; No nausea, vomiting, diarrhea, or abdominal pain GU: Negative; No dysuria, hematuria, or difficulty voiding Musculoskeletal: Negative; no myalgias, joint pain, or weakness Hematologic/Oncology: Negative; no easy bruising, bleeding Endocrine: Positive for hypothyroidism; no heat/cold intolerance; no diabetes Neuro: Positive for remote CVA; no changes in balance, headaches Skin: Negative; No rashes or skin lesions Psychiatric: Negative; No behavioral problems, depression Sleep: Negative; No snoring, daytime sleepiness, hypersomnolence, bruxism, restless legs, hypnogognic hallucinations, no cataplexy Other comprehensive 14 point system review is negative.   PE BP 154/76  Ht 5\' 4"  (1.626 m)  Wt  133 lb 8 oz (60.555 kg)  BMI 22.90 kg/m2 General: Alert, oriented, no distress.  Skin: normal turgor, no rashes HEENT: Normocephalic, atraumatic. Pupils round and reactive; sclera anicteric; extraocular muscles intact; Fundi without hemorrhages or exudates Nose without nasal septal hypertrophy Mouth/Parynx benign; Mallinpatti scale 3 Neck: No JVD, soft, bilateral carotid bruits; normal carotid upstroke Lungs: clear to ausculatation and percussion; no wheezing or rales Chest wall: without tenderness to palpitation Heart: RRR, s1 s2 normal 2/6 hard systolic murmur in the aortic region; no S3 gallop.  No rubs, thrills or heaves. Abdomen: soft, nontender; no hepatosplenomehaly, BS+; abdominal aorta nontender and not dilated by palpation. Back: no CVA tenderness Pulses 2+ Extremities: no clubbinbg cyanosis or edema, Homan's sign negative  Neurologic: grossly nonfocal; Cranial nerves grossly wnl Psychologic: Normal mood and affect  ECG (independently read by me): Sinus bradycardia 56 beats per minute.  Nonspecific ST changes.  QTc interval 449 ms  Prior ECG (independently read by me): Sinus bradycardia 52  beats per minute.  Nonspecific ST changes.  LABS:  BMET    Component Value Date/Time   NA 138 01/05/2013 0910   K 4.5 01/05/2013 0910   CL 100 01/05/2013 0910   CO2 32 01/05/2013 0910   GLUCOSE 99 01/05/2013 0910   BUN 23 01/05/2013 0910   CREATININE 1.17* 01/05/2013 0910   CREATININE 0.7 02/25/2009 0616   CALCIUM 9.6 01/05/2013 0910   GFRNONAA 53* 02/04/2009 1440   GFRAA  Value: >60        The eGFR has been calculated using the MDRD equation. This calculation has not been validated in all clinical situations. eGFR's persistently <60 mL/min signify possible Chronic Kidney Disease. 02/04/2009 1440     Hepatic Function Panel     Component Value Date/Time   PROT 7.0 01/05/2013 0910   ALBUMIN 4.0 01/05/2013 0910   AST 14 01/05/2013 0910   ALT 9 01/05/2013 0910   ALKPHOS 68 01/05/2013 0910    BILITOT 0.5 01/05/2013 0910     CBC    Component Value Date/Time   WBC 5.4 01/05/2013 0910   RBC 4.52 01/05/2013 0910   RBC 3.93 10/10/2008 1945   HGB 13.7 01/05/2013 0910   HCT 40.6 01/05/2013 0910   PLT 215 01/05/2013 0910   MCV 89.8 01/05/2013 0910   MCH 30.3 01/05/2013 0910   MCHC 33.7 01/05/2013 0910   RDW 15.3 01/05/2013 0910   LYMPHSABS 1.5 01/05/2013 0910   MONOABS 0.5 01/05/2013 0910   EOSABS 0.4 01/05/2013 0910   BASOSABS 0.0 01/05/2013 0910     BNP    Component Value Date/Time   PROBNP 796.0* 10/19/2008 0355    Lipid Panel     Component Value Date/Time   CHOL 267* 01/05/2013 0910   TRIG 327* 01/05/2013 0910   HDL 45 01/05/2013 0910   CHOLHDL 5.9 01/05/2013 0910   VLDL 65* 01/05/2013 0910   LDLCALC 157* 01/05/2013 0910     RADIOLOGY: No results found.   ASSESSMENT AND PLAN: Ms. Rosealyn Little is an 80 year old female who underwent CABG revascularization surgery following an aborted attempt at intervention to her LAD in May 2010.   She has documented bilateral carotid disease and is followed by vascular surgery and is status post remote CVA.  When I last saw her,  lipids were markedly abnormal and I increased her atorvastatin 40 mg a day and added Zetia 10 mg and attempt to be more aggressive.  She has not yet had followup blood work.  Her blood pressure today when rechecked by me was 1 4470 supine and 140/60 standing without orthostatic change.  She is on Benicar 20 mg, metoprolol, 50 mg, and hydrochlorothiazide 12.5 mg in addition to amlodipine 5 mg for blood pressure control. I had an extensive discussion with her regarding her most recent echo Doppler. Her aortic valve is more calcified and is at least moderate stenosis on her most recent echo.  She continues to have normal systolic function without wall motion abnormalities.  We will obtain followup blood work.  Adjustments will be made to her medical regimen if necessary.  I will see her in 4 months for cardiology  reevaluation.  Time spent: 25 minutes  Troy Sine, MD, [facility] 09/26/2013 8:15 AM

## 2013-10-20 ENCOUNTER — Other Ambulatory Visit: Payer: Self-pay | Admitting: *Deleted

## 2013-10-20 NOTE — Telephone Encounter (Signed)
Rx refill denied, request to send to PCP sent to pharmacy

## 2013-11-23 ENCOUNTER — Other Ambulatory Visit: Payer: Self-pay | Admitting: *Deleted

## 2013-11-23 MED ORDER — OLMESARTAN MEDOXOMIL 20 MG PO TABS: 20.0000 mg | ORAL_TABLET | Freq: Every day | ORAL | Status: AC

## 2013-11-23 NOTE — Telephone Encounter (Signed)
Rx was sent to pharmacy electronically. 

## 2014-01-19 ENCOUNTER — Ambulatory Visit: Payer: Medicare PPO | Admitting: Cardiovascular Disease

## 2014-02-12 ENCOUNTER — Other Ambulatory Visit: Payer: Self-pay

## 2014-02-12 MED ORDER — METOPROLOL TARTRATE 50 MG PO TABS: 50.0000 mg | ORAL_TABLET | Freq: Every day | ORAL | Status: DC

## 2014-02-12 NOTE — Telephone Encounter (Signed)
Rx sent to pharmacy   

## 2014-03-09 ENCOUNTER — Other Ambulatory Visit: Payer: Self-pay | Admitting: *Deleted

## 2014-03-09 MED ORDER — CLOPIDOGREL BISULFATE 75 MG PO TABS
75.0000 mg | ORAL_TABLET | Freq: Every day | ORAL | Status: DC
Start: 2014-03-09 — End: 2014-05-03

## 2014-03-09 MED ORDER — ATORVASTATIN CALCIUM 40 MG PO TABS: 40.0000 mg | ORAL_TABLET | Freq: Every day | ORAL | Status: DC

## 2014-03-09 NOTE — Telephone Encounter (Signed)
Refilled electronically with notation: Need an appointment for future refills 

## 2014-03-15 ENCOUNTER — Other Ambulatory Visit: Payer: Self-pay

## 2014-03-15 NOTE — Telephone Encounter (Signed)
ERROR

## 2014-04-05 ENCOUNTER — Other Ambulatory Visit: Payer: Self-pay

## 2014-04-05 MED ORDER — LEVOTHYROXINE SODIUM 100 MCG PO TABS: 100.0000 ug | ORAL_TABLET | Freq: Every day | ORAL | Status: AC

## 2014-04-05 NOTE — Telephone Encounter (Signed)
Rx sent to pharmacy   

## 2014-05-03 ENCOUNTER — Other Ambulatory Visit: Payer: Self-pay

## 2014-05-03 MED ORDER — CLOPIDOGREL BISULFATE 75 MG PO TABS: 75.0000 mg | ORAL_TABLET | Freq: Every day | ORAL | Status: DC

## 2014-05-03 MED ORDER — ATORVASTATIN CALCIUM 40 MG PO TABS: 40.0000 mg | ORAL_TABLET | Freq: Every day | ORAL | Status: DC

## 2014-05-03 NOTE — Telephone Encounter (Signed)
Rx(s) sent to pharmacy electronically.  

## 2014-05-31 DIAGNOSIS — H0289 Other specified disorders of eyelid: Secondary | ICD-10-CM | POA: Diagnosis not present

## 2014-05-31 DIAGNOSIS — E119 Type 2 diabetes mellitus without complications: Secondary | ICD-10-CM | POA: Diagnosis not present

## 2014-05-31 DIAGNOSIS — H40003 Preglaucoma, unspecified, bilateral: Secondary | ICD-10-CM | POA: Diagnosis not present

## 2014-09-20 ENCOUNTER — Other Ambulatory Visit: Payer: Self-pay | Admitting: Cardiovascular Disease

## 2014-11-26 ENCOUNTER — Encounter: Payer: Self-pay | Admitting: *Deleted

## 2014-12-02 ENCOUNTER — Encounter: Payer: Self-pay | Admitting: Cardiovascular Disease

## 2014-12-13 ENCOUNTER — Other Ambulatory Visit: Payer: Self-pay | Admitting: Cardiovascular Disease

## 2014-12-13 NOTE — Telephone Encounter (Signed)
Rx(s) sent to pharmacy electronically.  

## 2015-01-10 ENCOUNTER — Other Ambulatory Visit: Payer: Self-pay | Admitting: Cardiovascular Disease

## 2015-01-31 DIAGNOSIS — E119 Type 2 diabetes mellitus without complications: Secondary | ICD-10-CM | POA: Diagnosis not present

## 2015-02-07 ENCOUNTER — Other Ambulatory Visit: Payer: Self-pay | Admitting: Cardiovascular Disease

## 2015-02-08 DIAGNOSIS — I10 Essential (primary) hypertension: Secondary | ICD-10-CM | POA: Diagnosis not present

## 2015-02-08 DIAGNOSIS — Z6820 Body mass index (BMI) 20.0-20.9, adult: Secondary | ICD-10-CM | POA: Diagnosis not present

## 2015-02-08 DIAGNOSIS — E1129 Type 2 diabetes mellitus with other diabetic kidney complication: Secondary | ICD-10-CM | POA: Diagnosis not present

## 2015-02-08 DIAGNOSIS — F419 Anxiety disorder, unspecified: Secondary | ICD-10-CM | POA: Diagnosis not present

## 2015-03-24 ENCOUNTER — Ambulatory Visit (INDEPENDENT_AMBULATORY_CARE_PROVIDER_SITE_OTHER): Payer: Medicare PPO | Admitting: Otolaryngology

## 2015-03-24 DIAGNOSIS — H903 Sensorineural hearing loss, bilateral: Secondary | ICD-10-CM

## 2016-01-13 ENCOUNTER — Other Ambulatory Visit: Payer: Self-pay | Admitting: Cardiovascular Disease

## 2017-09-23 ENCOUNTER — Other Ambulatory Visit (HOSPITAL_COMMUNITY): Payer: Self-pay | Admitting: Internal Medicine

## 2017-09-23 DIAGNOSIS — I350 Nonrheumatic aortic (valve) stenosis: Secondary | ICD-10-CM

## 2017-09-30 ENCOUNTER — Ambulatory Visit (HOSPITAL_COMMUNITY)
Admission: RE | Admit: 2017-09-30 | Discharge: 2017-09-30 | Disposition: A | Discharge status: Home / Self Care | Payer: Medicare Other | Source: Ambulatory Visit | Attending: Internal Medicine | Admitting: Internal Medicine

## 2017-09-30 DIAGNOSIS — I083 Combined rheumatic disorders of mitral, aortic and tricuspid valves: Secondary | ICD-10-CM | POA: Diagnosis not present

## 2017-09-30 DIAGNOSIS — I350 Nonrheumatic aortic (valve) stenosis: Secondary | ICD-10-CM | POA: Diagnosis not present

## 2017-09-30 DIAGNOSIS — I2510 Atherosclerotic heart disease of native coronary artery without angina pectoris: Secondary | ICD-10-CM | POA: Insufficient documentation

## 2017-09-30 DIAGNOSIS — E785 Hyperlipidemia, unspecified: Secondary | ICD-10-CM | POA: Insufficient documentation

## 2017-09-30 DIAGNOSIS — I119 Hypertensive heart disease without heart failure: Secondary | ICD-10-CM | POA: Diagnosis not present

## 2017-09-30 DIAGNOSIS — Z8673 Personal history of transient ischemic attack (TIA), and cerebral infarction without residual deficits: Secondary | ICD-10-CM | POA: Insufficient documentation

## 2017-09-30 NOTE — Progress Notes (Signed)
*  PRELIMINARY RESULTS* Echocardiogram 2D Echocardiogram has been performed.  Stacey Drain 09/30/2017, 11:25 AM

## 2019-06-16 DIAGNOSIS — M19012 Primary osteoarthritis, left shoulder: Secondary | ICD-10-CM | POA: Diagnosis not present

## 2019-06-16 DIAGNOSIS — M5136 Other intervertebral disc degeneration, lumbar region: Secondary | ICD-10-CM | POA: Diagnosis not present

## 2019-07-28 DIAGNOSIS — M19012 Primary osteoarthritis, left shoulder: Secondary | ICD-10-CM | POA: Diagnosis not present

## 2019-07-28 DIAGNOSIS — E1140 Type 2 diabetes mellitus with diabetic neuropathy, unspecified: Secondary | ICD-10-CM | POA: Diagnosis not present

## 2019-07-28 DIAGNOSIS — M5136 Other intervertebral disc degeneration, lumbar region: Secondary | ICD-10-CM | POA: Diagnosis not present

## 2019-08-07 DIAGNOSIS — Z79899 Other long term (current) drug therapy: Secondary | ICD-10-CM | POA: Diagnosis not present

## 2019-08-07 DIAGNOSIS — I10 Essential (primary) hypertension: Secondary | ICD-10-CM | POA: Diagnosis not present

## 2019-08-07 DIAGNOSIS — E1129 Type 2 diabetes mellitus with other diabetic kidney complication: Secondary | ICD-10-CM | POA: Diagnosis not present

## 2019-08-07 DIAGNOSIS — G8929 Other chronic pain: Secondary | ICD-10-CM | POA: Diagnosis not present

## 2019-08-07 DIAGNOSIS — E039 Hypothyroidism, unspecified: Secondary | ICD-10-CM | POA: Diagnosis not present

## 2019-08-13 DIAGNOSIS — E1122 Type 2 diabetes mellitus with diabetic chronic kidney disease: Secondary | ICD-10-CM | POA: Diagnosis not present

## 2019-08-13 DIAGNOSIS — E785 Hyperlipidemia, unspecified: Secondary | ICD-10-CM | POA: Diagnosis not present

## 2019-08-13 DIAGNOSIS — N1832 Chronic kidney disease, stage 3b: Secondary | ICD-10-CM | POA: Diagnosis not present

## 2019-08-13 DIAGNOSIS — E039 Hypothyroidism, unspecified: Secondary | ICD-10-CM | POA: Diagnosis not present

## 2019-08-13 DIAGNOSIS — R7309 Other abnormal glucose: Secondary | ICD-10-CM | POA: Diagnosis not present

## 2019-08-13 DIAGNOSIS — I2510 Atherosclerotic heart disease of native coronary artery without angina pectoris: Secondary | ICD-10-CM | POA: Diagnosis not present

## 2019-09-08 DIAGNOSIS — M5136 Other intervertebral disc degeneration, lumbar region: Secondary | ICD-10-CM | POA: Diagnosis not present

## 2019-09-08 DIAGNOSIS — M19012 Primary osteoarthritis, left shoulder: Secondary | ICD-10-CM | POA: Diagnosis not present

## 2019-10-23 DIAGNOSIS — M25512 Pain in left shoulder: Secondary | ICD-10-CM | POA: Diagnosis not present

## 2019-10-23 DIAGNOSIS — M19012 Primary osteoarthritis, left shoulder: Secondary | ICD-10-CM | POA: Diagnosis not present

## 2019-12-03 DIAGNOSIS — Z79899 Other long term (current) drug therapy: Secondary | ICD-10-CM | POA: Diagnosis not present

## 2019-12-03 DIAGNOSIS — I2510 Atherosclerotic heart disease of native coronary artery without angina pectoris: Secondary | ICD-10-CM | POA: Diagnosis not present

## 2019-12-03 DIAGNOSIS — N183 Chronic kidney disease, stage 3 unspecified: Secondary | ICD-10-CM | POA: Diagnosis not present

## 2019-12-03 DIAGNOSIS — E039 Hypothyroidism, unspecified: Secondary | ICD-10-CM | POA: Diagnosis not present

## 2019-12-03 DIAGNOSIS — E1129 Type 2 diabetes mellitus with other diabetic kidney complication: Secondary | ICD-10-CM | POA: Diagnosis not present

## 2019-12-03 DIAGNOSIS — E785 Hyperlipidemia, unspecified: Secondary | ICD-10-CM | POA: Diagnosis not present

## 2019-12-10 DIAGNOSIS — E1122 Type 2 diabetes mellitus with diabetic chronic kidney disease: Secondary | ICD-10-CM | POA: Diagnosis not present

## 2019-12-10 DIAGNOSIS — N1831 Chronic kidney disease, stage 3a: Secondary | ICD-10-CM | POA: Diagnosis not present

## 2019-12-10 DIAGNOSIS — E039 Hypothyroidism, unspecified: Secondary | ICD-10-CM | POA: Diagnosis not present

## 2019-12-24 DIAGNOSIS — M542 Cervicalgia: Secondary | ICD-10-CM | POA: Diagnosis not present

## 2019-12-24 DIAGNOSIS — M25512 Pain in left shoulder: Secondary | ICD-10-CM | POA: Diagnosis not present

## 2019-12-24 DIAGNOSIS — M19012 Primary osteoarthritis, left shoulder: Secondary | ICD-10-CM | POA: Diagnosis not present

## 2020-01-17 DIAGNOSIS — I340 Nonrheumatic mitral (valve) insufficiency: Secondary | ICD-10-CM | POA: Diagnosis not present

## 2020-01-17 DIAGNOSIS — E785 Hyperlipidemia, unspecified: Secondary | ICD-10-CM | POA: Diagnosis not present

## 2020-01-17 DIAGNOSIS — K921 Melena: Secondary | ICD-10-CM | POA: Diagnosis not present

## 2020-01-17 DIAGNOSIS — E872 Acidosis: Secondary | ICD-10-CM | POA: Diagnosis not present

## 2020-01-17 DIAGNOSIS — I10 Essential (primary) hypertension: Secondary | ICD-10-CM | POA: Diagnosis not present

## 2020-01-17 DIAGNOSIS — E1165 Type 2 diabetes mellitus with hyperglycemia: Secondary | ICD-10-CM | POA: Diagnosis not present

## 2020-01-17 DIAGNOSIS — R58 Hemorrhage, not elsewhere classified: Secondary | ICD-10-CM | POA: Diagnosis not present

## 2020-01-17 DIAGNOSIS — R1111 Vomiting without nausea: Secondary | ICD-10-CM | POA: Diagnosis not present

## 2020-01-17 DIAGNOSIS — I351 Nonrheumatic aortic (valve) insufficiency: Secondary | ICD-10-CM | POA: Diagnosis not present

## 2020-01-17 DIAGNOSIS — I2510 Atherosclerotic heart disease of native coronary artery without angina pectoris: Secondary | ICD-10-CM | POA: Diagnosis not present

## 2020-01-17 DIAGNOSIS — R112 Nausea with vomiting, unspecified: Secondary | ICD-10-CM | POA: Diagnosis not present

## 2020-01-17 DIAGNOSIS — E1151 Type 2 diabetes mellitus with diabetic peripheral angiopathy without gangrene: Secondary | ICD-10-CM | POA: Diagnosis not present

## 2020-01-17 DIAGNOSIS — K920 Hematemesis: Secondary | ICD-10-CM | POA: Diagnosis not present

## 2020-01-17 DIAGNOSIS — I959 Hypotension, unspecified: Secondary | ICD-10-CM | POA: Diagnosis not present

## 2020-01-17 DIAGNOSIS — R531 Weakness: Secondary | ICD-10-CM | POA: Diagnosis not present

## 2020-01-17 DIAGNOSIS — D62 Acute posthemorrhagic anemia: Secondary | ICD-10-CM | POA: Diagnosis not present

## 2020-01-17 DIAGNOSIS — N179 Acute kidney failure, unspecified: Secondary | ICD-10-CM | POA: Diagnosis not present

## 2020-01-17 DIAGNOSIS — R000 Tachycardia, unspecified: Secondary | ICD-10-CM | POA: Diagnosis not present

## 2020-01-17 DIAGNOSIS — I350 Nonrheumatic aortic (valve) stenosis: Secondary | ICD-10-CM | POA: Diagnosis not present

## 2020-01-17 DIAGNOSIS — I471 Supraventricular tachycardia: Secondary | ICD-10-CM | POA: Diagnosis not present

## 2020-01-17 DIAGNOSIS — I517 Cardiomegaly: Secondary | ICD-10-CM | POA: Diagnosis not present

## 2020-01-17 DIAGNOSIS — Z7902 Long term (current) use of antithrombotics/antiplatelets: Secondary | ICD-10-CM | POA: Diagnosis not present

## 2020-01-17 DIAGNOSIS — R197 Diarrhea, unspecified: Secondary | ICD-10-CM | POA: Diagnosis not present

## 2020-01-17 DIAGNOSIS — J984 Other disorders of lung: Secondary | ICD-10-CM | POA: Diagnosis not present

## 2020-01-22 DIAGNOSIS — I350 Nonrheumatic aortic (valve) stenosis: Secondary | ICD-10-CM | POA: Diagnosis not present

## 2020-01-22 DIAGNOSIS — E785 Hyperlipidemia, unspecified: Secondary | ICD-10-CM | POA: Diagnosis not present

## 2020-01-22 DIAGNOSIS — K922 Gastrointestinal hemorrhage, unspecified: Secondary | ICD-10-CM | POA: Diagnosis not present

## 2020-01-22 DIAGNOSIS — D62 Acute posthemorrhagic anemia: Secondary | ICD-10-CM | POA: Diagnosis not present

## 2020-01-22 DIAGNOSIS — E1151 Type 2 diabetes mellitus with diabetic peripheral angiopathy without gangrene: Secondary | ICD-10-CM | POA: Diagnosis not present

## 2020-01-22 DIAGNOSIS — I6523 Occlusion and stenosis of bilateral carotid arteries: Secondary | ICD-10-CM | POA: Diagnosis not present

## 2020-01-22 DIAGNOSIS — I10 Essential (primary) hypertension: Secondary | ICD-10-CM | POA: Diagnosis not present

## 2020-01-22 DIAGNOSIS — I2510 Atherosclerotic heart disease of native coronary artery without angina pectoris: Secondary | ICD-10-CM | POA: Diagnosis not present

## 2020-01-22 DIAGNOSIS — D696 Thrombocytopenia, unspecified: Secondary | ICD-10-CM | POA: Diagnosis not present

## 2020-01-25 DIAGNOSIS — I6523 Occlusion and stenosis of bilateral carotid arteries: Secondary | ICD-10-CM | POA: Diagnosis not present

## 2020-01-25 DIAGNOSIS — E1151 Type 2 diabetes mellitus with diabetic peripheral angiopathy without gangrene: Secondary | ICD-10-CM | POA: Diagnosis not present

## 2020-01-25 DIAGNOSIS — E785 Hyperlipidemia, unspecified: Secondary | ICD-10-CM | POA: Diagnosis not present

## 2020-01-25 DIAGNOSIS — I2510 Atherosclerotic heart disease of native coronary artery without angina pectoris: Secondary | ICD-10-CM | POA: Diagnosis not present

## 2020-01-25 DIAGNOSIS — D696 Thrombocytopenia, unspecified: Secondary | ICD-10-CM | POA: Diagnosis not present

## 2020-01-25 DIAGNOSIS — D62 Acute posthemorrhagic anemia: Secondary | ICD-10-CM | POA: Diagnosis not present

## 2020-01-25 DIAGNOSIS — I10 Essential (primary) hypertension: Secondary | ICD-10-CM | POA: Diagnosis not present

## 2020-01-25 DIAGNOSIS — K922 Gastrointestinal hemorrhage, unspecified: Secondary | ICD-10-CM | POA: Diagnosis not present

## 2020-01-25 DIAGNOSIS — I350 Nonrheumatic aortic (valve) stenosis: Secondary | ICD-10-CM | POA: Diagnosis not present

## 2020-01-26 DIAGNOSIS — I350 Nonrheumatic aortic (valve) stenosis: Secondary | ICD-10-CM | POA: Diagnosis not present

## 2020-01-26 DIAGNOSIS — L03113 Cellulitis of right upper limb: Secondary | ICD-10-CM | POA: Diagnosis not present

## 2020-01-26 DIAGNOSIS — K922 Gastrointestinal hemorrhage, unspecified: Secondary | ICD-10-CM | POA: Diagnosis not present

## 2020-01-27 DIAGNOSIS — I10 Essential (primary) hypertension: Secondary | ICD-10-CM | POA: Diagnosis not present

## 2020-01-27 DIAGNOSIS — E1151 Type 2 diabetes mellitus with diabetic peripheral angiopathy without gangrene: Secondary | ICD-10-CM | POA: Diagnosis not present

## 2020-01-27 DIAGNOSIS — I6523 Occlusion and stenosis of bilateral carotid arteries: Secondary | ICD-10-CM | POA: Diagnosis not present

## 2020-01-27 DIAGNOSIS — E785 Hyperlipidemia, unspecified: Secondary | ICD-10-CM | POA: Diagnosis not present

## 2020-01-27 DIAGNOSIS — D696 Thrombocytopenia, unspecified: Secondary | ICD-10-CM | POA: Diagnosis not present

## 2020-01-27 DIAGNOSIS — I2510 Atherosclerotic heart disease of native coronary artery without angina pectoris: Secondary | ICD-10-CM | POA: Diagnosis not present

## 2020-01-27 DIAGNOSIS — K922 Gastrointestinal hemorrhage, unspecified: Secondary | ICD-10-CM | POA: Diagnosis not present

## 2020-01-27 DIAGNOSIS — D62 Acute posthemorrhagic anemia: Secondary | ICD-10-CM | POA: Diagnosis not present

## 2020-01-27 DIAGNOSIS — I350 Nonrheumatic aortic (valve) stenosis: Secondary | ICD-10-CM | POA: Diagnosis not present

## 2020-02-02 DIAGNOSIS — K922 Gastrointestinal hemorrhage, unspecified: Secondary | ICD-10-CM | POA: Diagnosis not present

## 2020-02-02 DIAGNOSIS — I6523 Occlusion and stenosis of bilateral carotid arteries: Secondary | ICD-10-CM | POA: Diagnosis not present

## 2020-02-02 DIAGNOSIS — E785 Hyperlipidemia, unspecified: Secondary | ICD-10-CM | POA: Diagnosis not present

## 2020-02-02 DIAGNOSIS — I350 Nonrheumatic aortic (valve) stenosis: Secondary | ICD-10-CM | POA: Diagnosis not present

## 2020-02-02 DIAGNOSIS — D62 Acute posthemorrhagic anemia: Secondary | ICD-10-CM | POA: Diagnosis not present

## 2020-02-02 DIAGNOSIS — I2510 Atherosclerotic heart disease of native coronary artery without angina pectoris: Secondary | ICD-10-CM | POA: Diagnosis not present

## 2020-02-02 DIAGNOSIS — I10 Essential (primary) hypertension: Secondary | ICD-10-CM | POA: Diagnosis not present

## 2020-02-02 DIAGNOSIS — D696 Thrombocytopenia, unspecified: Secondary | ICD-10-CM | POA: Diagnosis not present

## 2020-02-02 DIAGNOSIS — E1151 Type 2 diabetes mellitus with diabetic peripheral angiopathy without gangrene: Secondary | ICD-10-CM | POA: Diagnosis not present

## 2020-02-05 DIAGNOSIS — E785 Hyperlipidemia, unspecified: Secondary | ICD-10-CM | POA: Diagnosis not present

## 2020-02-05 DIAGNOSIS — D62 Acute posthemorrhagic anemia: Secondary | ICD-10-CM | POA: Diagnosis not present

## 2020-02-05 DIAGNOSIS — I6523 Occlusion and stenosis of bilateral carotid arteries: Secondary | ICD-10-CM | POA: Diagnosis not present

## 2020-02-05 DIAGNOSIS — E1151 Type 2 diabetes mellitus with diabetic peripheral angiopathy without gangrene: Secondary | ICD-10-CM | POA: Diagnosis not present

## 2020-02-05 DIAGNOSIS — D696 Thrombocytopenia, unspecified: Secondary | ICD-10-CM | POA: Diagnosis not present

## 2020-02-05 DIAGNOSIS — K922 Gastrointestinal hemorrhage, unspecified: Secondary | ICD-10-CM | POA: Diagnosis not present

## 2020-02-05 DIAGNOSIS — I10 Essential (primary) hypertension: Secondary | ICD-10-CM | POA: Diagnosis not present

## 2020-02-05 DIAGNOSIS — I350 Nonrheumatic aortic (valve) stenosis: Secondary | ICD-10-CM | POA: Diagnosis not present

## 2020-02-05 DIAGNOSIS — I2510 Atherosclerotic heart disease of native coronary artery without angina pectoris: Secondary | ICD-10-CM | POA: Diagnosis not present

## 2020-02-08 DIAGNOSIS — I350 Nonrheumatic aortic (valve) stenosis: Secondary | ICD-10-CM | POA: Diagnosis not present

## 2020-02-10 DIAGNOSIS — I350 Nonrheumatic aortic (valve) stenosis: Secondary | ICD-10-CM | POA: Diagnosis not present

## 2020-02-10 DIAGNOSIS — D62 Acute posthemorrhagic anemia: Secondary | ICD-10-CM | POA: Diagnosis not present

## 2020-02-10 DIAGNOSIS — E785 Hyperlipidemia, unspecified: Secondary | ICD-10-CM | POA: Diagnosis not present

## 2020-02-10 DIAGNOSIS — I6523 Occlusion and stenosis of bilateral carotid arteries: Secondary | ICD-10-CM | POA: Diagnosis not present

## 2020-02-10 DIAGNOSIS — D696 Thrombocytopenia, unspecified: Secondary | ICD-10-CM | POA: Diagnosis not present

## 2020-02-10 DIAGNOSIS — I2510 Atherosclerotic heart disease of native coronary artery without angina pectoris: Secondary | ICD-10-CM | POA: Diagnosis not present

## 2020-02-10 DIAGNOSIS — I10 Essential (primary) hypertension: Secondary | ICD-10-CM | POA: Diagnosis not present

## 2020-02-10 DIAGNOSIS — E1151 Type 2 diabetes mellitus with diabetic peripheral angiopathy without gangrene: Secondary | ICD-10-CM | POA: Diagnosis not present

## 2020-02-10 DIAGNOSIS — K922 Gastrointestinal hemorrhage, unspecified: Secondary | ICD-10-CM | POA: Diagnosis not present

## 2020-02-19 DIAGNOSIS — D62 Acute posthemorrhagic anemia: Secondary | ICD-10-CM | POA: Diagnosis not present

## 2020-02-19 DIAGNOSIS — K922 Gastrointestinal hemorrhage, unspecified: Secondary | ICD-10-CM | POA: Diagnosis not present

## 2020-02-19 DIAGNOSIS — E1151 Type 2 diabetes mellitus with diabetic peripheral angiopathy without gangrene: Secondary | ICD-10-CM | POA: Diagnosis not present

## 2020-02-19 DIAGNOSIS — I6523 Occlusion and stenosis of bilateral carotid arteries: Secondary | ICD-10-CM | POA: Diagnosis not present

## 2020-02-19 DIAGNOSIS — E785 Hyperlipidemia, unspecified: Secondary | ICD-10-CM | POA: Diagnosis not present

## 2020-02-19 DIAGNOSIS — I2510 Atherosclerotic heart disease of native coronary artery without angina pectoris: Secondary | ICD-10-CM | POA: Diagnosis not present

## 2020-02-19 DIAGNOSIS — I350 Nonrheumatic aortic (valve) stenosis: Secondary | ICD-10-CM | POA: Diagnosis not present

## 2020-02-19 DIAGNOSIS — D696 Thrombocytopenia, unspecified: Secondary | ICD-10-CM | POA: Diagnosis not present

## 2020-02-19 DIAGNOSIS — I10 Essential (primary) hypertension: Secondary | ICD-10-CM | POA: Diagnosis not present

## 2020-02-21 DIAGNOSIS — I10 Essential (primary) hypertension: Secondary | ICD-10-CM | POA: Diagnosis not present

## 2020-02-21 DIAGNOSIS — E785 Hyperlipidemia, unspecified: Secondary | ICD-10-CM | POA: Diagnosis not present

## 2020-02-21 DIAGNOSIS — E1151 Type 2 diabetes mellitus with diabetic peripheral angiopathy without gangrene: Secondary | ICD-10-CM | POA: Diagnosis not present

## 2020-02-21 DIAGNOSIS — I2510 Atherosclerotic heart disease of native coronary artery without angina pectoris: Secondary | ICD-10-CM | POA: Diagnosis not present

## 2020-02-21 DIAGNOSIS — D62 Acute posthemorrhagic anemia: Secondary | ICD-10-CM | POA: Diagnosis not present

## 2020-02-21 DIAGNOSIS — D696 Thrombocytopenia, unspecified: Secondary | ICD-10-CM | POA: Diagnosis not present

## 2020-02-21 DIAGNOSIS — I6523 Occlusion and stenosis of bilateral carotid arteries: Secondary | ICD-10-CM | POA: Diagnosis not present

## 2020-02-21 DIAGNOSIS — K922 Gastrointestinal hemorrhage, unspecified: Secondary | ICD-10-CM | POA: Diagnosis not present

## 2020-02-21 DIAGNOSIS — I350 Nonrheumatic aortic (valve) stenosis: Secondary | ICD-10-CM | POA: Diagnosis not present

## 2020-02-24 DIAGNOSIS — D696 Thrombocytopenia, unspecified: Secondary | ICD-10-CM | POA: Diagnosis not present

## 2020-02-24 DIAGNOSIS — I6523 Occlusion and stenosis of bilateral carotid arteries: Secondary | ICD-10-CM | POA: Diagnosis not present

## 2020-02-24 DIAGNOSIS — D62 Acute posthemorrhagic anemia: Secondary | ICD-10-CM | POA: Diagnosis not present

## 2020-02-24 DIAGNOSIS — K922 Gastrointestinal hemorrhage, unspecified: Secondary | ICD-10-CM | POA: Diagnosis not present

## 2020-02-24 DIAGNOSIS — E1151 Type 2 diabetes mellitus with diabetic peripheral angiopathy without gangrene: Secondary | ICD-10-CM | POA: Diagnosis not present

## 2020-02-24 DIAGNOSIS — E785 Hyperlipidemia, unspecified: Secondary | ICD-10-CM | POA: Diagnosis not present

## 2020-02-24 DIAGNOSIS — I10 Essential (primary) hypertension: Secondary | ICD-10-CM | POA: Diagnosis not present

## 2020-02-24 DIAGNOSIS — I350 Nonrheumatic aortic (valve) stenosis: Secondary | ICD-10-CM | POA: Diagnosis not present

## 2020-02-24 DIAGNOSIS — I2510 Atherosclerotic heart disease of native coronary artery without angina pectoris: Secondary | ICD-10-CM | POA: Diagnosis not present

## 2020-03-03 DIAGNOSIS — D62 Acute posthemorrhagic anemia: Secondary | ICD-10-CM | POA: Diagnosis not present

## 2020-03-03 DIAGNOSIS — I2510 Atherosclerotic heart disease of native coronary artery without angina pectoris: Secondary | ICD-10-CM | POA: Diagnosis not present

## 2020-03-03 DIAGNOSIS — E785 Hyperlipidemia, unspecified: Secondary | ICD-10-CM | POA: Diagnosis not present

## 2020-03-03 DIAGNOSIS — K922 Gastrointestinal hemorrhage, unspecified: Secondary | ICD-10-CM | POA: Diagnosis not present

## 2020-03-03 DIAGNOSIS — I6523 Occlusion and stenosis of bilateral carotid arteries: Secondary | ICD-10-CM | POA: Diagnosis not present

## 2020-03-03 DIAGNOSIS — E1151 Type 2 diabetes mellitus with diabetic peripheral angiopathy without gangrene: Secondary | ICD-10-CM | POA: Diagnosis not present

## 2020-03-03 DIAGNOSIS — D696 Thrombocytopenia, unspecified: Secondary | ICD-10-CM | POA: Diagnosis not present

## 2020-03-03 DIAGNOSIS — I350 Nonrheumatic aortic (valve) stenosis: Secondary | ICD-10-CM | POA: Diagnosis not present

## 2020-03-03 DIAGNOSIS — I10 Essential (primary) hypertension: Secondary | ICD-10-CM | POA: Diagnosis not present

## 2020-03-18 DIAGNOSIS — I6523 Occlusion and stenosis of bilateral carotid arteries: Secondary | ICD-10-CM | POA: Diagnosis not present

## 2020-03-18 DIAGNOSIS — D696 Thrombocytopenia, unspecified: Secondary | ICD-10-CM | POA: Diagnosis not present

## 2020-03-18 DIAGNOSIS — I10 Essential (primary) hypertension: Secondary | ICD-10-CM | POA: Diagnosis not present

## 2020-03-18 DIAGNOSIS — E785 Hyperlipidemia, unspecified: Secondary | ICD-10-CM | POA: Diagnosis not present

## 2020-03-18 DIAGNOSIS — E1151 Type 2 diabetes mellitus with diabetic peripheral angiopathy without gangrene: Secondary | ICD-10-CM | POA: Diagnosis not present

## 2020-03-18 DIAGNOSIS — D62 Acute posthemorrhagic anemia: Secondary | ICD-10-CM | POA: Diagnosis not present

## 2020-03-18 DIAGNOSIS — I350 Nonrheumatic aortic (valve) stenosis: Secondary | ICD-10-CM | POA: Diagnosis not present

## 2020-03-18 DIAGNOSIS — K922 Gastrointestinal hemorrhage, unspecified: Secondary | ICD-10-CM | POA: Diagnosis not present

## 2020-03-18 DIAGNOSIS — I2510 Atherosclerotic heart disease of native coronary artery without angina pectoris: Secondary | ICD-10-CM | POA: Diagnosis not present

## 2020-04-14 DIAGNOSIS — M25512 Pain in left shoulder: Secondary | ICD-10-CM | POA: Diagnosis not present

## 2020-04-14 DIAGNOSIS — M5091 Cervical disc disorder, unspecified,  high cervical region: Secondary | ICD-10-CM | POA: Diagnosis not present

## 2020-04-14 DIAGNOSIS — M19012 Primary osteoarthritis, left shoulder: Secondary | ICD-10-CM | POA: Diagnosis not present

## 2020-04-15 DIAGNOSIS — E1129 Type 2 diabetes mellitus with other diabetic kidney complication: Secondary | ICD-10-CM | POA: Diagnosis not present

## 2020-04-15 DIAGNOSIS — E039 Hypothyroidism, unspecified: Secondary | ICD-10-CM | POA: Diagnosis not present

## 2020-04-15 DIAGNOSIS — Z79899 Other long term (current) drug therapy: Secondary | ICD-10-CM | POA: Diagnosis not present

## 2020-04-15 DIAGNOSIS — N183 Chronic kidney disease, stage 3 unspecified: Secondary | ICD-10-CM | POA: Diagnosis not present

## 2020-04-26 DIAGNOSIS — K9501 Infection due to gastric band procedure: Secondary | ICD-10-CM | POA: Diagnosis not present

## 2020-04-26 DIAGNOSIS — R7309 Other abnormal glucose: Secondary | ICD-10-CM | POA: Diagnosis not present

## 2020-04-26 DIAGNOSIS — N183 Chronic kidney disease, stage 3 unspecified: Secondary | ICD-10-CM | POA: Diagnosis not present

## 2020-04-26 DIAGNOSIS — Z23 Encounter for immunization: Secondary | ICD-10-CM | POA: Diagnosis not present

## 2020-04-26 DIAGNOSIS — E1122 Type 2 diabetes mellitus with diabetic chronic kidney disease: Secondary | ICD-10-CM | POA: Diagnosis not present

## 2020-05-26 DIAGNOSIS — M19012 Primary osteoarthritis, left shoulder: Secondary | ICD-10-CM | POA: Diagnosis not present

## 2020-05-26 DIAGNOSIS — M5091 Cervical disc disorder, unspecified,  high cervical region: Secondary | ICD-10-CM | POA: Diagnosis not present

## 2020-05-26 DIAGNOSIS — M79622 Pain in left upper arm: Secondary | ICD-10-CM | POA: Diagnosis not present

## 2020-06-30 DIAGNOSIS — M50922 Unspecified cervical disc disorder at C5-C6 level: Secondary | ICD-10-CM | POA: Diagnosis not present

## 2020-06-30 DIAGNOSIS — M79622 Pain in left upper arm: Secondary | ICD-10-CM | POA: Diagnosis not present

## 2020-06-30 DIAGNOSIS — M19012 Primary osteoarthritis, left shoulder: Secondary | ICD-10-CM | POA: Diagnosis not present

## 2020-07-19 DIAGNOSIS — K922 Gastrointestinal hemorrhage, unspecified: Secondary | ICD-10-CM | POA: Diagnosis not present

## 2020-07-19 DIAGNOSIS — N183 Chronic kidney disease, stage 3 unspecified: Secondary | ICD-10-CM | POA: Diagnosis not present

## 2020-07-19 DIAGNOSIS — Z79899 Other long term (current) drug therapy: Secondary | ICD-10-CM | POA: Diagnosis not present

## 2020-07-19 DIAGNOSIS — F419 Anxiety disorder, unspecified: Secondary | ICD-10-CM | POA: Diagnosis not present

## 2020-07-19 DIAGNOSIS — I2510 Atherosclerotic heart disease of native coronary artery without angina pectoris: Secondary | ICD-10-CM | POA: Diagnosis not present

## 2020-07-19 DIAGNOSIS — E1129 Type 2 diabetes mellitus with other diabetic kidney complication: Secondary | ICD-10-CM | POA: Diagnosis not present

## 2020-07-19 DIAGNOSIS — I6350 Cerebral infarction due to unspecified occlusion or stenosis of unspecified cerebral artery: Secondary | ICD-10-CM | POA: Diagnosis not present

## 2020-07-26 DIAGNOSIS — N1831 Chronic kidney disease, stage 3a: Secondary | ICD-10-CM | POA: Diagnosis not present

## 2020-07-26 DIAGNOSIS — E785 Hyperlipidemia, unspecified: Secondary | ICD-10-CM | POA: Diagnosis not present

## 2020-07-26 DIAGNOSIS — I10 Essential (primary) hypertension: Secondary | ICD-10-CM | POA: Diagnosis not present

## 2020-07-26 DIAGNOSIS — R7309 Other abnormal glucose: Secondary | ICD-10-CM | POA: Diagnosis not present

## 2020-07-26 DIAGNOSIS — E039 Hypothyroidism, unspecified: Secondary | ICD-10-CM | POA: Diagnosis not present

## 2020-07-26 DIAGNOSIS — E1122 Type 2 diabetes mellitus with diabetic chronic kidney disease: Secondary | ICD-10-CM | POA: Diagnosis not present

## 2020-07-28 DIAGNOSIS — M19012 Primary osteoarthritis, left shoulder: Secondary | ICD-10-CM | POA: Diagnosis not present

## 2020-07-28 DIAGNOSIS — M5136 Other intervertebral disc degeneration, lumbar region: Secondary | ICD-10-CM | POA: Diagnosis not present

## 2020-07-28 DIAGNOSIS — M79622 Pain in left upper arm: Secondary | ICD-10-CM | POA: Diagnosis not present

## 2020-08-04 DIAGNOSIS — Z885 Allergy status to narcotic agent status: Secondary | ICD-10-CM | POA: Diagnosis not present

## 2020-08-04 DIAGNOSIS — M25552 Pain in left hip: Secondary | ICD-10-CM | POA: Diagnosis not present

## 2020-08-04 DIAGNOSIS — R2689 Other abnormalities of gait and mobility: Secondary | ICD-10-CM | POA: Diagnosis not present

## 2020-08-04 DIAGNOSIS — E785 Hyperlipidemia, unspecified: Secondary | ICD-10-CM | POA: Diagnosis not present

## 2020-08-04 DIAGNOSIS — I10 Essential (primary) hypertension: Secondary | ICD-10-CM | POA: Diagnosis not present

## 2020-08-09 DIAGNOSIS — M5136 Other intervertebral disc degeneration, lumbar region: Secondary | ICD-10-CM | POA: Diagnosis not present

## 2020-08-09 DIAGNOSIS — M5416 Radiculopathy, lumbar region: Secondary | ICD-10-CM | POA: Diagnosis not present

## 2020-08-18 DIAGNOSIS — M5416 Radiculopathy, lumbar region: Secondary | ICD-10-CM | POA: Diagnosis not present

## 2020-08-22 DIAGNOSIS — M5416 Radiculopathy, lumbar region: Secondary | ICD-10-CM | POA: Diagnosis not present

## 2020-08-22 DIAGNOSIS — M5126 Other intervertebral disc displacement, lumbar region: Secondary | ICD-10-CM | POA: Diagnosis not present

## 2020-08-22 DIAGNOSIS — M5136 Other intervertebral disc degeneration, lumbar region: Secondary | ICD-10-CM | POA: Diagnosis not present

## 2020-08-30 DIAGNOSIS — M5136 Other intervertebral disc degeneration, lumbar region: Secondary | ICD-10-CM | POA: Diagnosis not present

## 2020-08-30 DIAGNOSIS — M4854XA Collapsed vertebra, not elsewhere classified, thoracic region, initial encounter for fracture: Secondary | ICD-10-CM | POA: Diagnosis not present

## 2020-08-30 DIAGNOSIS — M5091 Cervical disc disorder, unspecified,  high cervical region: Secondary | ICD-10-CM | POA: Diagnosis not present

## 2020-08-30 DIAGNOSIS — E1140 Type 2 diabetes mellitus with diabetic neuropathy, unspecified: Secondary | ICD-10-CM | POA: Diagnosis not present

## 2020-08-30 DIAGNOSIS — M48061 Spinal stenosis, lumbar region without neurogenic claudication: Secondary | ICD-10-CM | POA: Diagnosis not present

## 2020-08-30 DIAGNOSIS — M545 Low back pain, unspecified: Secondary | ICD-10-CM | POA: Diagnosis not present

## 2020-08-30 DIAGNOSIS — M47816 Spondylosis without myelopathy or radiculopathy, lumbar region: Secondary | ICD-10-CM | POA: Diagnosis not present

## 2020-08-30 DIAGNOSIS — G8929 Other chronic pain: Secondary | ICD-10-CM | POA: Diagnosis not present

## 2020-08-30 DIAGNOSIS — M5416 Radiculopathy, lumbar region: Secondary | ICD-10-CM | POA: Diagnosis not present

## 2020-09-07 DIAGNOSIS — M5416 Radiculopathy, lumbar region: Secondary | ICD-10-CM | POA: Diagnosis not present

## 2020-09-07 DIAGNOSIS — E119 Type 2 diabetes mellitus without complications: Secondary | ICD-10-CM | POA: Diagnosis not present

## 2020-09-15 DIAGNOSIS — M5416 Radiculopathy, lumbar region: Secondary | ICD-10-CM | POA: Diagnosis not present

## 2020-09-15 DIAGNOSIS — M19012 Primary osteoarthritis, left shoulder: Secondary | ICD-10-CM | POA: Diagnosis not present

## 2020-09-15 DIAGNOSIS — M5136 Other intervertebral disc degeneration, lumbar region: Secondary | ICD-10-CM | POA: Diagnosis not present

## 2020-10-21 DIAGNOSIS — M50122 Cervical disc disorder at C5-C6 level with radiculopathy: Secondary | ICD-10-CM | POA: Diagnosis not present

## 2020-10-21 DIAGNOSIS — M5136 Other intervertebral disc degeneration, lumbar region: Secondary | ICD-10-CM | POA: Diagnosis not present

## 2020-10-21 DIAGNOSIS — M7552 Bursitis of left shoulder: Secondary | ICD-10-CM | POA: Diagnosis not present

## 2020-10-21 DIAGNOSIS — M19012 Primary osteoarthritis, left shoulder: Secondary | ICD-10-CM | POA: Diagnosis not present

## 2020-10-26 DIAGNOSIS — Z79899 Other long term (current) drug therapy: Secondary | ICD-10-CM | POA: Diagnosis not present

## 2020-10-26 DIAGNOSIS — N183 Chronic kidney disease, stage 3 unspecified: Secondary | ICD-10-CM | POA: Diagnosis not present

## 2020-10-26 DIAGNOSIS — E1129 Type 2 diabetes mellitus with other diabetic kidney complication: Secondary | ICD-10-CM | POA: Diagnosis not present

## 2020-11-03 DIAGNOSIS — I10 Essential (primary) hypertension: Secondary | ICD-10-CM | POA: Diagnosis not present

## 2020-11-03 DIAGNOSIS — N1831 Chronic kidney disease, stage 3a: Secondary | ICD-10-CM | POA: Diagnosis not present

## 2020-11-03 DIAGNOSIS — R7309 Other abnormal glucose: Secondary | ICD-10-CM | POA: Diagnosis not present

## 2020-11-03 DIAGNOSIS — J189 Pneumonia, unspecified organism: Secondary | ICD-10-CM | POA: Diagnosis not present

## 2020-11-03 DIAGNOSIS — E1122 Type 2 diabetes mellitus with diabetic chronic kidney disease: Secondary | ICD-10-CM | POA: Diagnosis not present

## 2020-12-09 DIAGNOSIS — M50122 Cervical disc disorder at C5-C6 level with radiculopathy: Secondary | ICD-10-CM | POA: Diagnosis not present

## 2020-12-09 DIAGNOSIS — M5136 Other intervertebral disc degeneration, lumbar region: Secondary | ICD-10-CM | POA: Diagnosis not present

## 2021-01-20 DIAGNOSIS — M50122 Cervical disc disorder at C5-C6 level with radiculopathy: Secondary | ICD-10-CM | POA: Diagnosis not present

## 2021-01-20 DIAGNOSIS — M5136 Other intervertebral disc degeneration, lumbar region: Secondary | ICD-10-CM | POA: Diagnosis not present

## 2021-01-27 DIAGNOSIS — I10 Essential (primary) hypertension: Secondary | ICD-10-CM | POA: Diagnosis not present

## 2021-01-27 DIAGNOSIS — N1831 Chronic kidney disease, stage 3a: Secondary | ICD-10-CM | POA: Diagnosis not present

## 2021-01-27 DIAGNOSIS — E1129 Type 2 diabetes mellitus with other diabetic kidney complication: Secondary | ICD-10-CM | POA: Diagnosis not present

## 2021-01-27 DIAGNOSIS — Z79899 Other long term (current) drug therapy: Secondary | ICD-10-CM | POA: Diagnosis not present

## 2021-01-27 DIAGNOSIS — G9009 Other idiopathic peripheral autonomic neuropathy: Secondary | ICD-10-CM | POA: Diagnosis not present

## 2021-02-03 DIAGNOSIS — R7309 Other abnormal glucose: Secondary | ICD-10-CM | POA: Diagnosis not present

## 2021-02-03 DIAGNOSIS — I10 Essential (primary) hypertension: Secondary | ICD-10-CM | POA: Diagnosis not present

## 2021-02-03 DIAGNOSIS — E1122 Type 2 diabetes mellitus with diabetic chronic kidney disease: Secondary | ICD-10-CM | POA: Diagnosis not present

## 2021-02-03 DIAGNOSIS — N1832 Chronic kidney disease, stage 3b: Secondary | ICD-10-CM | POA: Diagnosis not present

## 2021-02-03 DIAGNOSIS — Z23 Encounter for immunization: Secondary | ICD-10-CM | POA: Diagnosis not present

## 2021-02-20 DIAGNOSIS — M7552 Bursitis of left shoulder: Secondary | ICD-10-CM | POA: Diagnosis not present

## 2021-02-20 DIAGNOSIS — M5136 Other intervertebral disc degeneration, lumbar region: Secondary | ICD-10-CM | POA: Diagnosis not present

## 2021-02-20 DIAGNOSIS — M5126 Other intervertebral disc displacement, lumbar region: Secondary | ICD-10-CM | POA: Diagnosis not present

## 2021-02-20 DIAGNOSIS — M19012 Primary osteoarthritis, left shoulder: Secondary | ICD-10-CM | POA: Diagnosis not present

## 2021-02-22 DIAGNOSIS — M79622 Pain in left upper arm: Secondary | ICD-10-CM | POA: Diagnosis not present

## 2021-02-22 DIAGNOSIS — M5136 Other intervertebral disc degeneration, lumbar region: Secondary | ICD-10-CM | POA: Diagnosis not present

## 2021-02-27 DIAGNOSIS — M5090 Cervical disc disorder, unspecified, unspecified cervical region: Secondary | ICD-10-CM | POA: Diagnosis not present

## 2021-02-27 DIAGNOSIS — M4854XA Collapsed vertebra, not elsewhere classified, thoracic region, initial encounter for fracture: Secondary | ICD-10-CM | POA: Diagnosis not present

## 2021-02-27 DIAGNOSIS — M5416 Radiculopathy, lumbar region: Secondary | ICD-10-CM | POA: Diagnosis not present

## 2021-02-27 DIAGNOSIS — M5136 Other intervertebral disc degeneration, lumbar region: Secondary | ICD-10-CM | POA: Diagnosis not present

## 2021-02-27 DIAGNOSIS — M47816 Spondylosis without myelopathy or radiculopathy, lumbar region: Secondary | ICD-10-CM | POA: Diagnosis not present

## 2021-02-27 DIAGNOSIS — E1140 Type 2 diabetes mellitus with diabetic neuropathy, unspecified: Secondary | ICD-10-CM | POA: Diagnosis not present

## 2021-02-27 DIAGNOSIS — G8929 Other chronic pain: Secondary | ICD-10-CM | POA: Diagnosis not present

## 2021-02-27 DIAGNOSIS — M48061 Spinal stenosis, lumbar region without neurogenic claudication: Secondary | ICD-10-CM | POA: Diagnosis not present

## 2021-02-27 DIAGNOSIS — M545 Low back pain, unspecified: Secondary | ICD-10-CM | POA: Diagnosis not present

## 2021-03-20 DIAGNOSIS — G8929 Other chronic pain: Secondary | ICD-10-CM | POA: Diagnosis not present

## 2021-03-20 DIAGNOSIS — M545 Low back pain, unspecified: Secondary | ICD-10-CM | POA: Diagnosis not present

## 2021-03-20 DIAGNOSIS — M5090 Cervical disc disorder, unspecified, unspecified cervical region: Secondary | ICD-10-CM | POA: Diagnosis not present

## 2021-04-17 DIAGNOSIS — M5416 Radiculopathy, lumbar region: Secondary | ICD-10-CM | POA: Diagnosis not present

## 2021-04-17 DIAGNOSIS — M25512 Pain in left shoulder: Secondary | ICD-10-CM | POA: Diagnosis not present

## 2021-04-17 DIAGNOSIS — M545 Low back pain, unspecified: Secondary | ICD-10-CM | POA: Diagnosis not present

## 2021-04-17 DIAGNOSIS — G8929 Other chronic pain: Secondary | ICD-10-CM | POA: Diagnosis not present

## 2021-04-19 DIAGNOSIS — M5416 Radiculopathy, lumbar region: Secondary | ICD-10-CM | POA: Diagnosis not present

## 2021-05-17 DIAGNOSIS — G8929 Other chronic pain: Secondary | ICD-10-CM | POA: Diagnosis not present

## 2021-05-17 DIAGNOSIS — M5090 Cervical disc disorder, unspecified, unspecified cervical region: Secondary | ICD-10-CM | POA: Diagnosis not present

## 2021-05-17 DIAGNOSIS — M25512 Pain in left shoulder: Secondary | ICD-10-CM | POA: Diagnosis not present

## 2021-05-17 DIAGNOSIS — M5416 Radiculopathy, lumbar region: Secondary | ICD-10-CM | POA: Diagnosis not present

## 2021-06-20 DIAGNOSIS — N1832 Chronic kidney disease, stage 3b: Secondary | ICD-10-CM | POA: Diagnosis not present

## 2021-06-20 DIAGNOSIS — Z79899 Other long term (current) drug therapy: Secondary | ICD-10-CM | POA: Diagnosis not present

## 2021-06-20 DIAGNOSIS — I10 Essential (primary) hypertension: Secondary | ICD-10-CM | POA: Diagnosis not present

## 2021-06-27 DIAGNOSIS — I10 Essential (primary) hypertension: Secondary | ICD-10-CM | POA: Diagnosis not present

## 2021-06-27 DIAGNOSIS — N1832 Chronic kidney disease, stage 3b: Secondary | ICD-10-CM | POA: Diagnosis not present

## 2021-06-27 DIAGNOSIS — E1122 Type 2 diabetes mellitus with diabetic chronic kidney disease: Secondary | ICD-10-CM | POA: Diagnosis not present

## 2021-07-18 DIAGNOSIS — M7552 Bursitis of left shoulder: Secondary | ICD-10-CM | POA: Diagnosis not present

## 2021-07-18 DIAGNOSIS — M25512 Pain in left shoulder: Secondary | ICD-10-CM | POA: Diagnosis not present

## 2021-07-18 DIAGNOSIS — G8929 Other chronic pain: Secondary | ICD-10-CM | POA: Diagnosis not present

## 2021-08-15 DIAGNOSIS — M25512 Pain in left shoulder: Secondary | ICD-10-CM | POA: Diagnosis not present

## 2021-08-15 DIAGNOSIS — M5090 Cervical disc disorder, unspecified, unspecified cervical region: Secondary | ICD-10-CM | POA: Diagnosis not present

## 2021-08-15 DIAGNOSIS — G8929 Other chronic pain: Secondary | ICD-10-CM | POA: Diagnosis not present

## 2021-08-15 DIAGNOSIS — M7552 Bursitis of left shoulder: Secondary | ICD-10-CM | POA: Diagnosis not present

## 2021-09-28 DIAGNOSIS — M5090 Cervical disc disorder, unspecified, unspecified cervical region: Secondary | ICD-10-CM | POA: Diagnosis not present

## 2021-09-28 DIAGNOSIS — M7552 Bursitis of left shoulder: Secondary | ICD-10-CM | POA: Diagnosis not present

## 2021-11-17 DIAGNOSIS — M7551 Bursitis of right shoulder: Secondary | ICD-10-CM | POA: Diagnosis not present

## 2021-11-17 DIAGNOSIS — M19011 Primary osteoarthritis, right shoulder: Secondary | ICD-10-CM | POA: Diagnosis not present

## 2021-11-29 DIAGNOSIS — N1832 Chronic kidney disease, stage 3b: Secondary | ICD-10-CM | POA: Diagnosis not present

## 2021-11-29 DIAGNOSIS — E1129 Type 2 diabetes mellitus with other diabetic kidney complication: Secondary | ICD-10-CM | POA: Diagnosis not present

## 2021-11-29 DIAGNOSIS — Z79899 Other long term (current) drug therapy: Secondary | ICD-10-CM | POA: Diagnosis not present

## 2021-11-29 DIAGNOSIS — I10 Essential (primary) hypertension: Secondary | ICD-10-CM | POA: Diagnosis not present

## 2021-11-29 DIAGNOSIS — E559 Vitamin D deficiency, unspecified: Secondary | ICD-10-CM | POA: Diagnosis not present

## 2021-11-29 DIAGNOSIS — F419 Anxiety disorder, unspecified: Secondary | ICD-10-CM | POA: Diagnosis not present

## 2021-12-06 DIAGNOSIS — E1122 Type 2 diabetes mellitus with diabetic chronic kidney disease: Secondary | ICD-10-CM | POA: Diagnosis not present

## 2021-12-06 DIAGNOSIS — N1832 Chronic kidney disease, stage 3b: Secondary | ICD-10-CM | POA: Diagnosis not present

## 2021-12-06 DIAGNOSIS — I10 Essential (primary) hypertension: Secondary | ICD-10-CM | POA: Diagnosis not present

## 2021-12-06 DIAGNOSIS — R7309 Other abnormal glucose: Secondary | ICD-10-CM | POA: Diagnosis not present

## 2021-12-06 DIAGNOSIS — E785 Hyperlipidemia, unspecified: Secondary | ICD-10-CM | POA: Diagnosis not present

## 2021-12-06 DIAGNOSIS — E039 Hypothyroidism, unspecified: Secondary | ICD-10-CM | POA: Diagnosis not present

## 2021-12-18 DIAGNOSIS — M7551 Bursitis of right shoulder: Secondary | ICD-10-CM | POA: Diagnosis not present

## 2021-12-18 DIAGNOSIS — M19011 Primary osteoarthritis, right shoulder: Secondary | ICD-10-CM | POA: Diagnosis not present

## 2021-12-18 DIAGNOSIS — M5090 Cervical disc disorder, unspecified, unspecified cervical region: Secondary | ICD-10-CM | POA: Diagnosis not present

## 2022-01-16 DIAGNOSIS — M5090 Cervical disc disorder, unspecified, unspecified cervical region: Secondary | ICD-10-CM | POA: Diagnosis not present

## 2022-01-16 DIAGNOSIS — M7551 Bursitis of right shoulder: Secondary | ICD-10-CM | POA: Diagnosis not present

## 2022-02-14 DIAGNOSIS — M19011 Primary osteoarthritis, right shoulder: Secondary | ICD-10-CM | POA: Diagnosis not present

## 2022-02-14 DIAGNOSIS — M7551 Bursitis of right shoulder: Secondary | ICD-10-CM | POA: Diagnosis not present

## 2022-04-02 DIAGNOSIS — N1832 Chronic kidney disease, stage 3b: Secondary | ICD-10-CM | POA: Diagnosis not present

## 2022-04-02 DIAGNOSIS — E039 Hypothyroidism, unspecified: Secondary | ICD-10-CM | POA: Diagnosis not present

## 2022-04-02 DIAGNOSIS — E785 Hyperlipidemia, unspecified: Secondary | ICD-10-CM | POA: Diagnosis not present

## 2022-04-02 DIAGNOSIS — Z79899 Other long term (current) drug therapy: Secondary | ICD-10-CM | POA: Diagnosis not present

## 2022-04-02 DIAGNOSIS — E1129 Type 2 diabetes mellitus with other diabetic kidney complication: Secondary | ICD-10-CM | POA: Diagnosis not present

## 2022-04-02 DIAGNOSIS — I10 Essential (primary) hypertension: Secondary | ICD-10-CM | POA: Diagnosis not present

## 2022-04-12 DIAGNOSIS — M7552 Bursitis of left shoulder: Secondary | ICD-10-CM | POA: Diagnosis not present

## 2022-04-12 DIAGNOSIS — M5136 Other intervertebral disc degeneration, lumbar region: Secondary | ICD-10-CM | POA: Diagnosis not present

## 2022-05-17 DIAGNOSIS — M7552 Bursitis of left shoulder: Secondary | ICD-10-CM | POA: Diagnosis not present

## 2022-05-17 DIAGNOSIS — M5090 Cervical disc disorder, unspecified, unspecified cervical region: Secondary | ICD-10-CM | POA: Diagnosis not present

## 2022-06-21 DIAGNOSIS — M7551 Bursitis of right shoulder: Secondary | ICD-10-CM | POA: Diagnosis not present

## 2022-06-21 DIAGNOSIS — G8929 Other chronic pain: Secondary | ICD-10-CM | POA: Diagnosis not present

## 2022-06-21 DIAGNOSIS — M25512 Pain in left shoulder: Secondary | ICD-10-CM | POA: Diagnosis not present

## 2022-06-21 DIAGNOSIS — M19011 Primary osteoarthritis, right shoulder: Secondary | ICD-10-CM | POA: Diagnosis not present

## 2022-07-20 DIAGNOSIS — M25511 Pain in right shoulder: Secondary | ICD-10-CM | POA: Diagnosis not present

## 2022-07-20 DIAGNOSIS — M7551 Bursitis of right shoulder: Secondary | ICD-10-CM | POA: Diagnosis not present

## 2022-08-21 DIAGNOSIS — M19012 Primary osteoarthritis, left shoulder: Secondary | ICD-10-CM | POA: Diagnosis not present

## 2022-08-21 DIAGNOSIS — G8929 Other chronic pain: Secondary | ICD-10-CM | POA: Diagnosis not present

## 2022-08-21 DIAGNOSIS — M25512 Pain in left shoulder: Secondary | ICD-10-CM | POA: Diagnosis not present

## 2022-09-18 DIAGNOSIS — R269 Unspecified abnormalities of gait and mobility: Secondary | ICD-10-CM | POA: Diagnosis not present

## 2022-09-18 DIAGNOSIS — M5136 Other intervertebral disc degeneration, lumbar region: Secondary | ICD-10-CM | POA: Diagnosis not present

## 2022-09-18 DIAGNOSIS — M19012 Primary osteoarthritis, left shoulder: Secondary | ICD-10-CM | POA: Diagnosis not present

## 2022-10-18 DIAGNOSIS — M25512 Pain in left shoulder: Secondary | ICD-10-CM | POA: Diagnosis not present

## 2022-10-18 DIAGNOSIS — M7552 Bursitis of left shoulder: Secondary | ICD-10-CM | POA: Diagnosis not present

## 2022-10-18 DIAGNOSIS — G8929 Other chronic pain: Secondary | ICD-10-CM | POA: Diagnosis not present

## 2022-11-13 DIAGNOSIS — N1832 Chronic kidney disease, stage 3b: Secondary | ICD-10-CM | POA: Diagnosis not present

## 2022-11-13 DIAGNOSIS — E039 Hypothyroidism, unspecified: Secondary | ICD-10-CM | POA: Diagnosis not present

## 2022-11-13 DIAGNOSIS — E1129 Type 2 diabetes mellitus with other diabetic kidney complication: Secondary | ICD-10-CM | POA: Diagnosis not present

## 2022-11-13 DIAGNOSIS — Z79899 Other long term (current) drug therapy: Secondary | ICD-10-CM | POA: Diagnosis not present

## 2022-11-13 DIAGNOSIS — I10 Essential (primary) hypertension: Secondary | ICD-10-CM | POA: Diagnosis not present

## 2022-11-13 DIAGNOSIS — E785 Hyperlipidemia, unspecified: Secondary | ICD-10-CM | POA: Diagnosis not present

## 2022-11-23 DIAGNOSIS — I10 Essential (primary) hypertension: Secondary | ICD-10-CM | POA: Diagnosis not present

## 2022-11-23 DIAGNOSIS — D519 Vitamin B12 deficiency anemia, unspecified: Secondary | ICD-10-CM | POA: Diagnosis not present

## 2022-11-23 DIAGNOSIS — N1831 Chronic kidney disease, stage 3a: Secondary | ICD-10-CM | POA: Diagnosis not present

## 2022-11-23 DIAGNOSIS — R7309 Other abnormal glucose: Secondary | ICD-10-CM | POA: Diagnosis not present

## 2022-11-23 DIAGNOSIS — E1122 Type 2 diabetes mellitus with diabetic chronic kidney disease: Secondary | ICD-10-CM | POA: Diagnosis not present

## 2022-11-29 DIAGNOSIS — G8929 Other chronic pain: Secondary | ICD-10-CM | POA: Diagnosis not present

## 2022-11-29 DIAGNOSIS — M7552 Bursitis of left shoulder: Secondary | ICD-10-CM | POA: Diagnosis not present

## 2022-11-29 DIAGNOSIS — M25512 Pain in left shoulder: Secondary | ICD-10-CM | POA: Diagnosis not present

## 2023-01-11 DIAGNOSIS — G8929 Other chronic pain: Secondary | ICD-10-CM | POA: Diagnosis not present

## 2023-01-11 DIAGNOSIS — M25512 Pain in left shoulder: Secondary | ICD-10-CM | POA: Diagnosis not present

## 2023-01-11 DIAGNOSIS — M7552 Bursitis of left shoulder: Secondary | ICD-10-CM | POA: Diagnosis not present

## 2023-01-11 DIAGNOSIS — M19012 Primary osteoarthritis, left shoulder: Secondary | ICD-10-CM | POA: Diagnosis not present

## 2023-02-21 DIAGNOSIS — M19012 Primary osteoarthritis, left shoulder: Secondary | ICD-10-CM | POA: Diagnosis not present

## 2023-02-21 DIAGNOSIS — M7552 Bursitis of left shoulder: Secondary | ICD-10-CM | POA: Diagnosis not present

## 2023-04-18 DIAGNOSIS — M19012 Primary osteoarthritis, left shoulder: Secondary | ICD-10-CM | POA: Diagnosis not present

## 2023-04-18 DIAGNOSIS — S41119A Laceration without foreign body of unspecified upper arm, initial encounter: Secondary | ICD-10-CM | POA: Diagnosis not present

## 2023-04-18 DIAGNOSIS — M7552 Bursitis of left shoulder: Secondary | ICD-10-CM | POA: Diagnosis not present

## 2023-05-30 DIAGNOSIS — M19012 Primary osteoarthritis, left shoulder: Secondary | ICD-10-CM | POA: Diagnosis not present

## 2023-05-30 DIAGNOSIS — M7552 Bursitis of left shoulder: Secondary | ICD-10-CM | POA: Diagnosis not present

## 2023-07-11 DIAGNOSIS — M19012 Primary osteoarthritis, left shoulder: Secondary | ICD-10-CM | POA: Diagnosis not present

## 2023-07-11 DIAGNOSIS — M7552 Bursitis of left shoulder: Secondary | ICD-10-CM | POA: Diagnosis not present

## 2023-07-23 DIAGNOSIS — K219 Gastro-esophageal reflux disease without esophagitis: Secondary | ICD-10-CM | POA: Diagnosis not present

## 2023-07-23 DIAGNOSIS — E039 Hypothyroidism, unspecified: Secondary | ICD-10-CM | POA: Diagnosis not present

## 2023-07-23 DIAGNOSIS — I63232 Cerebral infarction due to unspecified occlusion or stenosis of left carotid arteries: Secondary | ICD-10-CM | POA: Diagnosis not present

## 2023-07-23 DIAGNOSIS — R29810 Facial weakness: Secondary | ICD-10-CM | POA: Diagnosis not present

## 2023-07-23 DIAGNOSIS — N179 Acute kidney failure, unspecified: Secondary | ICD-10-CM | POA: Diagnosis not present

## 2023-07-23 DIAGNOSIS — I5020 Unspecified systolic (congestive) heart failure: Secondary | ICD-10-CM | POA: Diagnosis not present

## 2023-07-23 DIAGNOSIS — Z79890 Hormone replacement therapy (postmenopausal): Secondary | ICD-10-CM | POA: Diagnosis not present

## 2023-07-23 DIAGNOSIS — Z7902 Long term (current) use of antithrombotics/antiplatelets: Secondary | ICD-10-CM | POA: Diagnosis not present

## 2023-07-23 DIAGNOSIS — R4182 Altered mental status, unspecified: Secondary | ICD-10-CM | POA: Diagnosis not present

## 2023-07-23 DIAGNOSIS — I6522 Occlusion and stenosis of left carotid artery: Secondary | ICD-10-CM | POA: Diagnosis not present

## 2023-07-23 DIAGNOSIS — H547 Unspecified visual loss: Secondary | ICD-10-CM | POA: Diagnosis not present

## 2023-07-23 DIAGNOSIS — R410 Disorientation, unspecified: Secondary | ICD-10-CM | POA: Diagnosis not present

## 2023-07-23 DIAGNOSIS — I509 Heart failure, unspecified: Secondary | ICD-10-CM | POA: Diagnosis not present

## 2023-07-23 DIAGNOSIS — M1990 Unspecified osteoarthritis, unspecified site: Secondary | ICD-10-CM | POA: Diagnosis not present

## 2023-07-23 DIAGNOSIS — E7800 Pure hypercholesterolemia, unspecified: Secondary | ICD-10-CM | POA: Diagnosis not present

## 2023-07-23 DIAGNOSIS — I10 Essential (primary) hypertension: Secondary | ICD-10-CM | POA: Diagnosis not present

## 2023-07-24 DIAGNOSIS — I352 Nonrheumatic aortic (valve) stenosis with insufficiency: Secondary | ICD-10-CM | POA: Diagnosis not present

## 2023-07-24 DIAGNOSIS — I639 Cerebral infarction, unspecified: Secondary | ICD-10-CM | POA: Diagnosis not present

## 2023-07-24 DIAGNOSIS — G459 Transient cerebral ischemic attack, unspecified: Secondary | ICD-10-CM | POA: Diagnosis not present

## 2023-07-24 DIAGNOSIS — E039 Hypothyroidism, unspecified: Secondary | ICD-10-CM | POA: Diagnosis not present

## 2023-07-24 DIAGNOSIS — I340 Nonrheumatic mitral (valve) insufficiency: Secondary | ICD-10-CM | POA: Diagnosis not present

## 2023-07-24 DIAGNOSIS — E785 Hyperlipidemia, unspecified: Secondary | ICD-10-CM | POA: Diagnosis not present

## 2023-07-24 DIAGNOSIS — I501 Left ventricular failure: Secondary | ICD-10-CM | POA: Diagnosis not present

## 2023-07-24 DIAGNOSIS — G9340 Encephalopathy, unspecified: Secondary | ICD-10-CM | POA: Diagnosis not present

## 2023-07-25 DIAGNOSIS — I63233 Cerebral infarction due to unspecified occlusion or stenosis of bilateral carotid arteries: Secondary | ICD-10-CM | POA: Diagnosis not present

## 2023-07-25 DIAGNOSIS — E785 Hyperlipidemia, unspecified: Secondary | ICD-10-CM | POA: Diagnosis not present

## 2023-07-25 DIAGNOSIS — G9340 Encephalopathy, unspecified: Secondary | ICD-10-CM | POA: Diagnosis not present

## 2023-07-25 DIAGNOSIS — I639 Cerebral infarction, unspecified: Secondary | ICD-10-CM | POA: Diagnosis not present

## 2023-07-25 DIAGNOSIS — G459 Transient cerebral ischemic attack, unspecified: Secondary | ICD-10-CM | POA: Diagnosis not present

## 2023-07-25 DIAGNOSIS — E039 Hypothyroidism, unspecified: Secondary | ICD-10-CM | POA: Diagnosis not present

## 2023-07-26 DIAGNOSIS — G9340 Encephalopathy, unspecified: Secondary | ICD-10-CM | POA: Diagnosis not present

## 2023-07-26 DIAGNOSIS — E782 Mixed hyperlipidemia: Secondary | ICD-10-CM | POA: Diagnosis not present

## 2023-07-26 DIAGNOSIS — I639 Cerebral infarction, unspecified: Secondary | ICD-10-CM | POA: Diagnosis not present

## 2023-07-26 DIAGNOSIS — E039 Hypothyroidism, unspecified: Secondary | ICD-10-CM | POA: Diagnosis not present

## 2023-07-26 DIAGNOSIS — I2510 Atherosclerotic heart disease of native coronary artery without angina pectoris: Secondary | ICD-10-CM | POA: Diagnosis not present

## 2023-07-26 DIAGNOSIS — G459 Transient cerebral ischemic attack, unspecified: Secondary | ICD-10-CM | POA: Diagnosis not present

## 2023-07-26 DIAGNOSIS — I255 Ischemic cardiomyopathy: Secondary | ICD-10-CM | POA: Diagnosis not present

## 2023-07-26 DIAGNOSIS — E785 Hyperlipidemia, unspecified: Secondary | ICD-10-CM | POA: Diagnosis not present

## 2023-07-26 DIAGNOSIS — I10 Essential (primary) hypertension: Secondary | ICD-10-CM | POA: Diagnosis not present

## 2023-07-27 DIAGNOSIS — I639 Cerebral infarction, unspecified: Secondary | ICD-10-CM | POA: Diagnosis not present

## 2023-07-27 DIAGNOSIS — E785 Hyperlipidemia, unspecified: Secondary | ICD-10-CM | POA: Diagnosis not present

## 2023-07-27 DIAGNOSIS — E039 Hypothyroidism, unspecified: Secondary | ICD-10-CM | POA: Diagnosis not present

## 2023-07-27 DIAGNOSIS — G9340 Encephalopathy, unspecified: Secondary | ICD-10-CM | POA: Diagnosis not present

## 2023-07-27 DIAGNOSIS — G459 Transient cerebral ischemic attack, unspecified: Secondary | ICD-10-CM | POA: Diagnosis not present

## 2023-07-28 DIAGNOSIS — E039 Hypothyroidism, unspecified: Secondary | ICD-10-CM | POA: Diagnosis not present

## 2023-07-28 DIAGNOSIS — G9340 Encephalopathy, unspecified: Secondary | ICD-10-CM | POA: Diagnosis not present

## 2023-07-28 DIAGNOSIS — G459 Transient cerebral ischemic attack, unspecified: Secondary | ICD-10-CM | POA: Diagnosis not present

## 2023-07-28 DIAGNOSIS — E785 Hyperlipidemia, unspecified: Secondary | ICD-10-CM | POA: Diagnosis not present

## 2023-07-28 DIAGNOSIS — I639 Cerebral infarction, unspecified: Secondary | ICD-10-CM | POA: Diagnosis not present

## 2023-07-29 DIAGNOSIS — R531 Weakness: Secondary | ICD-10-CM | POA: Diagnosis not present

## 2023-07-29 DIAGNOSIS — G9340 Encephalopathy, unspecified: Secondary | ICD-10-CM | POA: Diagnosis not present

## 2023-07-29 DIAGNOSIS — I63232 Cerebral infarction due to unspecified occlusion or stenosis of left carotid arteries: Secondary | ICD-10-CM | POA: Diagnosis not present

## 2023-07-29 DIAGNOSIS — G459 Transient cerebral ischemic attack, unspecified: Secondary | ICD-10-CM | POA: Diagnosis not present

## 2023-07-29 DIAGNOSIS — H53462 Homonymous bilateral field defects, left side: Secondary | ICD-10-CM | POA: Diagnosis not present

## 2023-07-29 DIAGNOSIS — I5020 Unspecified systolic (congestive) heart failure: Secondary | ICD-10-CM | POA: Diagnosis not present

## 2023-07-29 DIAGNOSIS — I10 Essential (primary) hypertension: Secondary | ICD-10-CM | POA: Diagnosis not present

## 2023-07-29 DIAGNOSIS — N390 Urinary tract infection, site not specified: Secondary | ICD-10-CM | POA: Diagnosis not present

## 2023-07-29 DIAGNOSIS — E1140 Type 2 diabetes mellitus with diabetic neuropathy, unspecified: Secondary | ICD-10-CM | POA: Diagnosis not present

## 2023-07-29 DIAGNOSIS — Z23 Encounter for immunization: Secondary | ICD-10-CM | POA: Diagnosis not present

## 2023-07-29 DIAGNOSIS — I63531 Cerebral infarction due to unspecified occlusion or stenosis of right posterior cerebral artery: Secondary | ICD-10-CM | POA: Diagnosis not present

## 2023-07-29 DIAGNOSIS — E7800 Pure hypercholesterolemia, unspecified: Secondary | ICD-10-CM | POA: Diagnosis not present

## 2023-07-29 DIAGNOSIS — I69328 Other speech and language deficits following cerebral infarction: Secondary | ICD-10-CM | POA: Diagnosis not present

## 2023-07-29 DIAGNOSIS — I69312 Visuospatial deficit and spatial neglect following cerebral infarction: Secondary | ICD-10-CM | POA: Diagnosis not present

## 2023-07-29 DIAGNOSIS — M6281 Muscle weakness (generalized): Secondary | ICD-10-CM | POA: Diagnosis not present

## 2023-07-29 DIAGNOSIS — I110 Hypertensive heart disease with heart failure: Secondary | ICD-10-CM | POA: Diagnosis not present

## 2023-07-29 DIAGNOSIS — E785 Hyperlipidemia, unspecified: Secondary | ICD-10-CM | POA: Diagnosis not present

## 2023-07-29 DIAGNOSIS — Z1159 Encounter for screening for other viral diseases: Secondary | ICD-10-CM | POA: Diagnosis not present

## 2023-07-29 DIAGNOSIS — E039 Hypothyroidism, unspecified: Secondary | ICD-10-CM | POA: Diagnosis not present

## 2023-07-29 DIAGNOSIS — R7401 Elevation of levels of liver transaminase levels: Secondary | ICD-10-CM | POA: Diagnosis not present

## 2023-07-29 DIAGNOSIS — Z79890 Hormone replacement therapy (postmenopausal): Secondary | ICD-10-CM | POA: Diagnosis not present

## 2023-07-29 DIAGNOSIS — N179 Acute kidney failure, unspecified: Secondary | ICD-10-CM | POA: Diagnosis not present

## 2023-07-29 DIAGNOSIS — M1990 Unspecified osteoarthritis, unspecified site: Secondary | ICD-10-CM | POA: Diagnosis not present

## 2023-07-29 DIAGNOSIS — Z7401 Bed confinement status: Secondary | ICD-10-CM | POA: Diagnosis not present

## 2023-07-29 DIAGNOSIS — I639 Cerebral infarction, unspecified: Secondary | ICD-10-CM | POA: Diagnosis not present

## 2023-07-29 DIAGNOSIS — M159 Polyosteoarthritis, unspecified: Secondary | ICD-10-CM | POA: Diagnosis not present

## 2023-07-29 DIAGNOSIS — Z7902 Long term (current) use of antithrombotics/antiplatelets: Secondary | ICD-10-CM | POA: Diagnosis not present

## 2023-07-29 DIAGNOSIS — K219 Gastro-esophageal reflux disease without esophagitis: Secondary | ICD-10-CM | POA: Diagnosis not present

## 2023-07-30 DIAGNOSIS — E785 Hyperlipidemia, unspecified: Secondary | ICD-10-CM | POA: Diagnosis not present

## 2023-07-30 DIAGNOSIS — I63531 Cerebral infarction due to unspecified occlusion or stenosis of right posterior cerebral artery: Secondary | ICD-10-CM | POA: Diagnosis not present

## 2023-07-30 DIAGNOSIS — N179 Acute kidney failure, unspecified: Secondary | ICD-10-CM | POA: Diagnosis not present

## 2023-07-30 DIAGNOSIS — E039 Hypothyroidism, unspecified: Secondary | ICD-10-CM | POA: Diagnosis not present

## 2023-07-30 DIAGNOSIS — E1140 Type 2 diabetes mellitus with diabetic neuropathy, unspecified: Secondary | ICD-10-CM | POA: Diagnosis not present

## 2023-07-30 DIAGNOSIS — I10 Essential (primary) hypertension: Secondary | ICD-10-CM | POA: Diagnosis not present

## 2023-07-30 DIAGNOSIS — I5020 Unspecified systolic (congestive) heart failure: Secondary | ICD-10-CM | POA: Diagnosis not present

## 2023-07-30 DIAGNOSIS — R7401 Elevation of levels of liver transaminase levels: Secondary | ICD-10-CM | POA: Diagnosis not present

## 2023-07-31 DIAGNOSIS — I63531 Cerebral infarction due to unspecified occlusion or stenosis of right posterior cerebral artery: Secondary | ICD-10-CM | POA: Diagnosis not present

## 2023-07-31 DIAGNOSIS — I5020 Unspecified systolic (congestive) heart failure: Secondary | ICD-10-CM | POA: Diagnosis not present

## 2023-07-31 DIAGNOSIS — I10 Essential (primary) hypertension: Secondary | ICD-10-CM | POA: Diagnosis not present

## 2023-07-31 DIAGNOSIS — E1140 Type 2 diabetes mellitus with diabetic neuropathy, unspecified: Secondary | ICD-10-CM | POA: Diagnosis not present

## 2023-07-31 DIAGNOSIS — E039 Hypothyroidism, unspecified: Secondary | ICD-10-CM | POA: Diagnosis not present

## 2023-07-31 DIAGNOSIS — R7401 Elevation of levels of liver transaminase levels: Secondary | ICD-10-CM | POA: Diagnosis not present

## 2023-07-31 DIAGNOSIS — N179 Acute kidney failure, unspecified: Secondary | ICD-10-CM | POA: Diagnosis not present

## 2023-07-31 DIAGNOSIS — E785 Hyperlipidemia, unspecified: Secondary | ICD-10-CM | POA: Diagnosis not present

## 2023-08-01 DIAGNOSIS — E1140 Type 2 diabetes mellitus with diabetic neuropathy, unspecified: Secondary | ICD-10-CM | POA: Diagnosis not present

## 2023-08-01 DIAGNOSIS — I5020 Unspecified systolic (congestive) heart failure: Secondary | ICD-10-CM | POA: Diagnosis not present

## 2023-08-01 DIAGNOSIS — I10 Essential (primary) hypertension: Secondary | ICD-10-CM | POA: Diagnosis not present

## 2023-08-01 DIAGNOSIS — N179 Acute kidney failure, unspecified: Secondary | ICD-10-CM | POA: Diagnosis not present

## 2023-08-01 DIAGNOSIS — R7401 Elevation of levels of liver transaminase levels: Secondary | ICD-10-CM | POA: Diagnosis not present

## 2023-08-01 DIAGNOSIS — I63531 Cerebral infarction due to unspecified occlusion or stenosis of right posterior cerebral artery: Secondary | ICD-10-CM | POA: Diagnosis not present

## 2023-08-01 DIAGNOSIS — E039 Hypothyroidism, unspecified: Secondary | ICD-10-CM | POA: Diagnosis not present

## 2023-08-01 DIAGNOSIS — E785 Hyperlipidemia, unspecified: Secondary | ICD-10-CM | POA: Diagnosis not present

## 2023-08-03 DIAGNOSIS — M159 Polyosteoarthritis, unspecified: Secondary | ICD-10-CM | POA: Diagnosis not present

## 2023-08-03 DIAGNOSIS — H53462 Homonymous bilateral field defects, left side: Secondary | ICD-10-CM | POA: Diagnosis not present

## 2023-08-03 DIAGNOSIS — I110 Hypertensive heart disease with heart failure: Secondary | ICD-10-CM | POA: Diagnosis not present

## 2023-08-03 DIAGNOSIS — M6281 Muscle weakness (generalized): Secondary | ICD-10-CM | POA: Diagnosis not present

## 2023-08-03 DIAGNOSIS — Z1159 Encounter for screening for other viral diseases: Secondary | ICD-10-CM | POA: Diagnosis not present

## 2023-08-03 DIAGNOSIS — I69312 Visuospatial deficit and spatial neglect following cerebral infarction: Secondary | ICD-10-CM | POA: Diagnosis not present

## 2023-08-03 DIAGNOSIS — Z23 Encounter for immunization: Secondary | ICD-10-CM | POA: Diagnosis not present

## 2023-08-03 DIAGNOSIS — I63531 Cerebral infarction due to unspecified occlusion or stenosis of right posterior cerebral artery: Secondary | ICD-10-CM | POA: Diagnosis not present

## 2023-08-03 DIAGNOSIS — E1140 Type 2 diabetes mellitus with diabetic neuropathy, unspecified: Secondary | ICD-10-CM | POA: Diagnosis not present

## 2023-08-05 DIAGNOSIS — I63531 Cerebral infarction due to unspecified occlusion or stenosis of right posterior cerebral artery: Secondary | ICD-10-CM | POA: Diagnosis not present

## 2023-08-05 DIAGNOSIS — H53462 Homonymous bilateral field defects, left side: Secondary | ICD-10-CM | POA: Diagnosis not present

## 2023-08-05 DIAGNOSIS — I110 Hypertensive heart disease with heart failure: Secondary | ICD-10-CM | POA: Diagnosis not present

## 2023-08-05 DIAGNOSIS — M159 Polyosteoarthritis, unspecified: Secondary | ICD-10-CM | POA: Diagnosis not present

## 2023-08-05 DIAGNOSIS — M6281 Muscle weakness (generalized): Secondary | ICD-10-CM | POA: Diagnosis not present

## 2023-08-05 DIAGNOSIS — E1140 Type 2 diabetes mellitus with diabetic neuropathy, unspecified: Secondary | ICD-10-CM | POA: Diagnosis not present

## 2023-08-05 DIAGNOSIS — I69312 Visuospatial deficit and spatial neglect following cerebral infarction: Secondary | ICD-10-CM | POA: Diagnosis not present

## 2023-08-05 DIAGNOSIS — Z1159 Encounter for screening for other viral diseases: Secondary | ICD-10-CM | POA: Diagnosis not present

## 2023-08-05 DIAGNOSIS — Z23 Encounter for immunization: Secondary | ICD-10-CM | POA: Diagnosis not present

## 2023-08-06 DIAGNOSIS — I63531 Cerebral infarction due to unspecified occlusion or stenosis of right posterior cerebral artery: Secondary | ICD-10-CM | POA: Diagnosis not present

## 2023-08-06 DIAGNOSIS — E1140 Type 2 diabetes mellitus with diabetic neuropathy, unspecified: Secondary | ICD-10-CM | POA: Diagnosis not present

## 2023-08-06 DIAGNOSIS — M6281 Muscle weakness (generalized): Secondary | ICD-10-CM | POA: Diagnosis not present

## 2023-08-06 DIAGNOSIS — Z1159 Encounter for screening for other viral diseases: Secondary | ICD-10-CM | POA: Diagnosis not present

## 2023-08-06 DIAGNOSIS — Z23 Encounter for immunization: Secondary | ICD-10-CM | POA: Diagnosis not present

## 2023-08-06 DIAGNOSIS — I69312 Visuospatial deficit and spatial neglect following cerebral infarction: Secondary | ICD-10-CM | POA: Diagnosis not present

## 2023-08-06 DIAGNOSIS — M159 Polyosteoarthritis, unspecified: Secondary | ICD-10-CM | POA: Diagnosis not present

## 2023-08-06 DIAGNOSIS — I110 Hypertensive heart disease with heart failure: Secondary | ICD-10-CM | POA: Diagnosis not present

## 2023-08-06 DIAGNOSIS — H53462 Homonymous bilateral field defects, left side: Secondary | ICD-10-CM | POA: Diagnosis not present

## 2023-08-16 DIAGNOSIS — Z23 Encounter for immunization: Secondary | ICD-10-CM | POA: Diagnosis not present

## 2023-08-16 DIAGNOSIS — H53462 Homonymous bilateral field defects, left side: Secondary | ICD-10-CM | POA: Diagnosis not present

## 2023-08-16 DIAGNOSIS — M6281 Muscle weakness (generalized): Secondary | ICD-10-CM | POA: Diagnosis not present

## 2023-08-16 DIAGNOSIS — I110 Hypertensive heart disease with heart failure: Secondary | ICD-10-CM | POA: Diagnosis not present

## 2023-08-16 DIAGNOSIS — N390 Urinary tract infection, site not specified: Secondary | ICD-10-CM | POA: Diagnosis not present

## 2023-08-16 DIAGNOSIS — Z1159 Encounter for screening for other viral diseases: Secondary | ICD-10-CM | POA: Diagnosis not present

## 2023-08-16 DIAGNOSIS — E1140 Type 2 diabetes mellitus with diabetic neuropathy, unspecified: Secondary | ICD-10-CM | POA: Diagnosis not present

## 2023-08-16 DIAGNOSIS — I69312 Visuospatial deficit and spatial neglect following cerebral infarction: Secondary | ICD-10-CM | POA: Diagnosis not present

## 2023-08-16 DIAGNOSIS — N179 Acute kidney failure, unspecified: Secondary | ICD-10-CM | POA: Diagnosis not present

## 2023-08-17 DIAGNOSIS — N390 Urinary tract infection, site not specified: Secondary | ICD-10-CM | POA: Diagnosis not present

## 2023-08-17 DIAGNOSIS — Z1159 Encounter for screening for other viral diseases: Secondary | ICD-10-CM | POA: Diagnosis not present

## 2023-08-17 DIAGNOSIS — H53462 Homonymous bilateral field defects, left side: Secondary | ICD-10-CM | POA: Diagnosis not present

## 2023-08-17 DIAGNOSIS — M6281 Muscle weakness (generalized): Secondary | ICD-10-CM | POA: Diagnosis not present

## 2023-08-17 DIAGNOSIS — I110 Hypertensive heart disease with heart failure: Secondary | ICD-10-CM | POA: Diagnosis not present

## 2023-08-17 DIAGNOSIS — N179 Acute kidney failure, unspecified: Secondary | ICD-10-CM | POA: Diagnosis not present

## 2023-08-17 DIAGNOSIS — I69312 Visuospatial deficit and spatial neglect following cerebral infarction: Secondary | ICD-10-CM | POA: Diagnosis not present

## 2023-08-17 DIAGNOSIS — Z23 Encounter for immunization: Secondary | ICD-10-CM | POA: Diagnosis not present

## 2023-08-17 DIAGNOSIS — E1140 Type 2 diabetes mellitus with diabetic neuropathy, unspecified: Secondary | ICD-10-CM | POA: Diagnosis not present

## 2023-08-22 DIAGNOSIS — I69312 Visuospatial deficit and spatial neglect following cerebral infarction: Secondary | ICD-10-CM | POA: Diagnosis not present

## 2023-08-22 DIAGNOSIS — Z23 Encounter for immunization: Secondary | ICD-10-CM | POA: Diagnosis not present

## 2023-08-22 DIAGNOSIS — H53462 Homonymous bilateral field defects, left side: Secondary | ICD-10-CM | POA: Diagnosis not present

## 2023-08-22 DIAGNOSIS — E1140 Type 2 diabetes mellitus with diabetic neuropathy, unspecified: Secondary | ICD-10-CM | POA: Diagnosis not present

## 2023-08-22 DIAGNOSIS — I110 Hypertensive heart disease with heart failure: Secondary | ICD-10-CM | POA: Diagnosis not present

## 2023-08-22 DIAGNOSIS — N179 Acute kidney failure, unspecified: Secondary | ICD-10-CM | POA: Diagnosis not present

## 2023-08-22 DIAGNOSIS — N390 Urinary tract infection, site not specified: Secondary | ICD-10-CM | POA: Diagnosis not present

## 2023-08-22 DIAGNOSIS — Z1159 Encounter for screening for other viral diseases: Secondary | ICD-10-CM | POA: Diagnosis not present

## 2023-08-22 DIAGNOSIS — I69328 Other speech and language deficits following cerebral infarction: Secondary | ICD-10-CM | POA: Diagnosis not present

## 2023-08-22 DIAGNOSIS — I63531 Cerebral infarction due to unspecified occlusion or stenosis of right posterior cerebral artery: Secondary | ICD-10-CM | POA: Diagnosis not present

## 2023-08-22 DIAGNOSIS — M6281 Muscle weakness (generalized): Secondary | ICD-10-CM | POA: Diagnosis not present

## 2023-08-23 DIAGNOSIS — N390 Urinary tract infection, site not specified: Secondary | ICD-10-CM | POA: Diagnosis not present

## 2023-08-23 DIAGNOSIS — M6281 Muscle weakness (generalized): Secondary | ICD-10-CM | POA: Diagnosis not present

## 2023-08-23 DIAGNOSIS — E1140 Type 2 diabetes mellitus with diabetic neuropathy, unspecified: Secondary | ICD-10-CM | POA: Diagnosis not present

## 2023-08-23 DIAGNOSIS — I110 Hypertensive heart disease with heart failure: Secondary | ICD-10-CM | POA: Diagnosis not present

## 2023-08-23 DIAGNOSIS — Z23 Encounter for immunization: Secondary | ICD-10-CM | POA: Diagnosis not present

## 2023-08-23 DIAGNOSIS — I63531 Cerebral infarction due to unspecified occlusion or stenosis of right posterior cerebral artery: Secondary | ICD-10-CM | POA: Diagnosis not present

## 2023-08-23 DIAGNOSIS — I69312 Visuospatial deficit and spatial neglect following cerebral infarction: Secondary | ICD-10-CM | POA: Diagnosis not present

## 2023-08-23 DIAGNOSIS — I69328 Other speech and language deficits following cerebral infarction: Secondary | ICD-10-CM | POA: Diagnosis not present

## 2023-08-23 DIAGNOSIS — H53462 Homonymous bilateral field defects, left side: Secondary | ICD-10-CM | POA: Diagnosis not present

## 2023-08-23 DIAGNOSIS — Z1159 Encounter for screening for other viral diseases: Secondary | ICD-10-CM | POA: Diagnosis not present

## 2023-08-24 DIAGNOSIS — I110 Hypertensive heart disease with heart failure: Secondary | ICD-10-CM | POA: Diagnosis not present

## 2023-08-24 DIAGNOSIS — I63531 Cerebral infarction due to unspecified occlusion or stenosis of right posterior cerebral artery: Secondary | ICD-10-CM | POA: Diagnosis not present

## 2023-08-24 DIAGNOSIS — H53462 Homonymous bilateral field defects, left side: Secondary | ICD-10-CM | POA: Diagnosis not present

## 2023-08-24 DIAGNOSIS — Z23 Encounter for immunization: Secondary | ICD-10-CM | POA: Diagnosis not present

## 2023-08-24 DIAGNOSIS — M6281 Muscle weakness (generalized): Secondary | ICD-10-CM | POA: Diagnosis not present

## 2023-08-24 DIAGNOSIS — I69312 Visuospatial deficit and spatial neglect following cerebral infarction: Secondary | ICD-10-CM | POA: Diagnosis not present

## 2023-08-24 DIAGNOSIS — N390 Urinary tract infection, site not specified: Secondary | ICD-10-CM | POA: Diagnosis not present

## 2023-08-24 DIAGNOSIS — E1140 Type 2 diabetes mellitus with diabetic neuropathy, unspecified: Secondary | ICD-10-CM | POA: Diagnosis not present

## 2023-08-24 DIAGNOSIS — Z1159 Encounter for screening for other viral diseases: Secondary | ICD-10-CM | POA: Diagnosis not present

## 2023-08-25 DIAGNOSIS — Z23 Encounter for immunization: Secondary | ICD-10-CM | POA: Diagnosis not present

## 2023-08-25 DIAGNOSIS — I69328 Other speech and language deficits following cerebral infarction: Secondary | ICD-10-CM | POA: Diagnosis not present

## 2023-08-25 DIAGNOSIS — Z1159 Encounter for screening for other viral diseases: Secondary | ICD-10-CM | POA: Diagnosis not present

## 2023-08-25 DIAGNOSIS — M6281 Muscle weakness (generalized): Secondary | ICD-10-CM | POA: Diagnosis not present

## 2023-08-25 DIAGNOSIS — I63531 Cerebral infarction due to unspecified occlusion or stenosis of right posterior cerebral artery: Secondary | ICD-10-CM | POA: Diagnosis not present

## 2023-08-26 DIAGNOSIS — E1140 Type 2 diabetes mellitus with diabetic neuropathy, unspecified: Secondary | ICD-10-CM | POA: Diagnosis not present

## 2023-08-26 DIAGNOSIS — I110 Hypertensive heart disease with heart failure: Secondary | ICD-10-CM | POA: Diagnosis not present

## 2023-08-26 DIAGNOSIS — N390 Urinary tract infection, site not specified: Secondary | ICD-10-CM | POA: Diagnosis not present

## 2023-08-26 DIAGNOSIS — I69312 Visuospatial deficit and spatial neglect following cerebral infarction: Secondary | ICD-10-CM | POA: Diagnosis not present

## 2023-08-26 DIAGNOSIS — H53462 Homonymous bilateral field defects, left side: Secondary | ICD-10-CM | POA: Diagnosis not present

## 2023-08-26 DIAGNOSIS — M6281 Muscle weakness (generalized): Secondary | ICD-10-CM | POA: Diagnosis not present

## 2023-08-26 DIAGNOSIS — Z23 Encounter for immunization: Secondary | ICD-10-CM | POA: Diagnosis not present

## 2023-08-26 DIAGNOSIS — I63531 Cerebral infarction due to unspecified occlusion or stenosis of right posterior cerebral artery: Secondary | ICD-10-CM | POA: Diagnosis not present

## 2023-08-26 DIAGNOSIS — Z1159 Encounter for screening for other viral diseases: Secondary | ICD-10-CM | POA: Diagnosis not present

## 2023-08-27 DIAGNOSIS — I63531 Cerebral infarction due to unspecified occlusion or stenosis of right posterior cerebral artery: Secondary | ICD-10-CM | POA: Diagnosis not present

## 2023-08-27 DIAGNOSIS — E1140 Type 2 diabetes mellitus with diabetic neuropathy, unspecified: Secondary | ICD-10-CM | POA: Diagnosis not present

## 2023-08-27 DIAGNOSIS — I110 Hypertensive heart disease with heart failure: Secondary | ICD-10-CM | POA: Diagnosis not present

## 2023-08-27 DIAGNOSIS — M159 Polyosteoarthritis, unspecified: Secondary | ICD-10-CM | POA: Diagnosis not present

## 2023-08-27 DIAGNOSIS — I5020 Unspecified systolic (congestive) heart failure: Secondary | ICD-10-CM | POA: Diagnosis not present

## 2023-08-27 DIAGNOSIS — M6281 Muscle weakness (generalized): Secondary | ICD-10-CM | POA: Diagnosis not present

## 2023-09-04 DIAGNOSIS — I69312 Visuospatial deficit and spatial neglect following cerebral infarction: Secondary | ICD-10-CM | POA: Diagnosis not present

## 2023-09-04 DIAGNOSIS — E1140 Type 2 diabetes mellitus with diabetic neuropathy, unspecified: Secondary | ICD-10-CM | POA: Diagnosis not present

## 2023-09-04 DIAGNOSIS — N390 Urinary tract infection, site not specified: Secondary | ICD-10-CM | POA: Diagnosis not present

## 2023-09-04 DIAGNOSIS — I110 Hypertensive heart disease with heart failure: Secondary | ICD-10-CM | POA: Diagnosis not present

## 2023-09-04 DIAGNOSIS — I5020 Unspecified systolic (congestive) heart failure: Secondary | ICD-10-CM | POA: Diagnosis not present

## 2023-09-04 DIAGNOSIS — H53462 Homonymous bilateral field defects, left side: Secondary | ICD-10-CM | POA: Diagnosis not present

## 2023-09-05 DIAGNOSIS — I69312 Visuospatial deficit and spatial neglect following cerebral infarction: Secondary | ICD-10-CM | POA: Diagnosis not present

## 2023-09-05 DIAGNOSIS — I110 Hypertensive heart disease with heart failure: Secondary | ICD-10-CM | POA: Diagnosis not present

## 2023-09-05 DIAGNOSIS — I5020 Unspecified systolic (congestive) heart failure: Secondary | ICD-10-CM | POA: Diagnosis not present

## 2023-09-05 DIAGNOSIS — N390 Urinary tract infection, site not specified: Secondary | ICD-10-CM | POA: Diagnosis not present

## 2023-09-05 DIAGNOSIS — H53462 Homonymous bilateral field defects, left side: Secondary | ICD-10-CM | POA: Diagnosis not present

## 2023-09-05 DIAGNOSIS — E1140 Type 2 diabetes mellitus with diabetic neuropathy, unspecified: Secondary | ICD-10-CM | POA: Diagnosis not present

## 2023-09-10 DIAGNOSIS — I110 Hypertensive heart disease with heart failure: Secondary | ICD-10-CM | POA: Diagnosis not present

## 2023-09-10 DIAGNOSIS — I5020 Unspecified systolic (congestive) heart failure: Secondary | ICD-10-CM | POA: Diagnosis not present

## 2023-09-10 DIAGNOSIS — I69312 Visuospatial deficit and spatial neglect following cerebral infarction: Secondary | ICD-10-CM | POA: Diagnosis not present

## 2023-09-10 DIAGNOSIS — E1140 Type 2 diabetes mellitus with diabetic neuropathy, unspecified: Secondary | ICD-10-CM | POA: Diagnosis not present

## 2023-09-10 DIAGNOSIS — H53462 Homonymous bilateral field defects, left side: Secondary | ICD-10-CM | POA: Diagnosis not present

## 2023-09-10 DIAGNOSIS — N390 Urinary tract infection, site not specified: Secondary | ICD-10-CM | POA: Diagnosis not present

## 2023-09-13 DIAGNOSIS — I5020 Unspecified systolic (congestive) heart failure: Secondary | ICD-10-CM | POA: Diagnosis not present

## 2023-09-13 DIAGNOSIS — H53462 Homonymous bilateral field defects, left side: Secondary | ICD-10-CM | POA: Diagnosis not present

## 2023-09-13 DIAGNOSIS — N390 Urinary tract infection, site not specified: Secondary | ICD-10-CM | POA: Diagnosis not present

## 2023-09-13 DIAGNOSIS — E1140 Type 2 diabetes mellitus with diabetic neuropathy, unspecified: Secondary | ICD-10-CM | POA: Diagnosis not present

## 2023-09-13 DIAGNOSIS — I110 Hypertensive heart disease with heart failure: Secondary | ICD-10-CM | POA: Diagnosis not present

## 2023-09-13 DIAGNOSIS — I69312 Visuospatial deficit and spatial neglect following cerebral infarction: Secondary | ICD-10-CM | POA: Diagnosis not present

## 2023-09-17 DIAGNOSIS — N390 Urinary tract infection, site not specified: Secondary | ICD-10-CM | POA: Diagnosis not present

## 2023-09-17 DIAGNOSIS — H53462 Homonymous bilateral field defects, left side: Secondary | ICD-10-CM | POA: Diagnosis not present

## 2023-09-17 DIAGNOSIS — I5020 Unspecified systolic (congestive) heart failure: Secondary | ICD-10-CM | POA: Diagnosis not present

## 2023-09-17 DIAGNOSIS — I110 Hypertensive heart disease with heart failure: Secondary | ICD-10-CM | POA: Diagnosis not present

## 2023-09-17 DIAGNOSIS — I69312 Visuospatial deficit and spatial neglect following cerebral infarction: Secondary | ICD-10-CM | POA: Diagnosis not present

## 2023-09-17 DIAGNOSIS — E1140 Type 2 diabetes mellitus with diabetic neuropathy, unspecified: Secondary | ICD-10-CM | POA: Diagnosis not present

## 2023-09-19 DIAGNOSIS — H53462 Homonymous bilateral field defects, left side: Secondary | ICD-10-CM | POA: Diagnosis not present

## 2023-09-19 DIAGNOSIS — I69312 Visuospatial deficit and spatial neglect following cerebral infarction: Secondary | ICD-10-CM | POA: Diagnosis not present

## 2023-09-19 DIAGNOSIS — N390 Urinary tract infection, site not specified: Secondary | ICD-10-CM | POA: Diagnosis not present

## 2023-09-19 DIAGNOSIS — I110 Hypertensive heart disease with heart failure: Secondary | ICD-10-CM | POA: Diagnosis not present

## 2023-09-19 DIAGNOSIS — I5020 Unspecified systolic (congestive) heart failure: Secondary | ICD-10-CM | POA: Diagnosis not present

## 2023-09-19 DIAGNOSIS — E1140 Type 2 diabetes mellitus with diabetic neuropathy, unspecified: Secondary | ICD-10-CM | POA: Diagnosis not present

## 2023-09-24 DIAGNOSIS — H53462 Homonymous bilateral field defects, left side: Secondary | ICD-10-CM | POA: Diagnosis not present

## 2023-09-24 DIAGNOSIS — I5020 Unspecified systolic (congestive) heart failure: Secondary | ICD-10-CM | POA: Diagnosis not present

## 2023-09-24 DIAGNOSIS — I69312 Visuospatial deficit and spatial neglect following cerebral infarction: Secondary | ICD-10-CM | POA: Diagnosis not present

## 2023-09-24 DIAGNOSIS — N390 Urinary tract infection, site not specified: Secondary | ICD-10-CM | POA: Diagnosis not present

## 2023-09-24 DIAGNOSIS — E1140 Type 2 diabetes mellitus with diabetic neuropathy, unspecified: Secondary | ICD-10-CM | POA: Diagnosis not present

## 2023-09-24 DIAGNOSIS — I110 Hypertensive heart disease with heart failure: Secondary | ICD-10-CM | POA: Diagnosis not present

## 2023-09-25 DIAGNOSIS — I110 Hypertensive heart disease with heart failure: Secondary | ICD-10-CM | POA: Diagnosis not present

## 2023-09-25 DIAGNOSIS — I5020 Unspecified systolic (congestive) heart failure: Secondary | ICD-10-CM | POA: Diagnosis not present

## 2023-09-25 DIAGNOSIS — E1140 Type 2 diabetes mellitus with diabetic neuropathy, unspecified: Secondary | ICD-10-CM | POA: Diagnosis not present

## 2023-09-25 DIAGNOSIS — H53462 Homonymous bilateral field defects, left side: Secondary | ICD-10-CM | POA: Diagnosis not present

## 2023-09-25 DIAGNOSIS — I69312 Visuospatial deficit and spatial neglect following cerebral infarction: Secondary | ICD-10-CM | POA: Diagnosis not present

## 2023-09-25 DIAGNOSIS — N390 Urinary tract infection, site not specified: Secondary | ICD-10-CM | POA: Diagnosis not present

## 2023-09-26 DIAGNOSIS — E1140 Type 2 diabetes mellitus with diabetic neuropathy, unspecified: Secondary | ICD-10-CM | POA: Diagnosis not present

## 2023-09-26 DIAGNOSIS — I110 Hypertensive heart disease with heart failure: Secondary | ICD-10-CM | POA: Diagnosis not present

## 2023-09-26 DIAGNOSIS — M159 Polyosteoarthritis, unspecified: Secondary | ICD-10-CM | POA: Diagnosis not present

## 2023-09-26 DIAGNOSIS — I63531 Cerebral infarction due to unspecified occlusion or stenosis of right posterior cerebral artery: Secondary | ICD-10-CM | POA: Diagnosis not present

## 2023-09-26 DIAGNOSIS — I5020 Unspecified systolic (congestive) heart failure: Secondary | ICD-10-CM | POA: Diagnosis not present

## 2023-09-26 DIAGNOSIS — M6281 Muscle weakness (generalized): Secondary | ICD-10-CM | POA: Diagnosis not present

## 2023-10-01 DIAGNOSIS — H53462 Homonymous bilateral field defects, left side: Secondary | ICD-10-CM | POA: Diagnosis not present

## 2023-10-01 DIAGNOSIS — N390 Urinary tract infection, site not specified: Secondary | ICD-10-CM | POA: Diagnosis not present

## 2023-10-01 DIAGNOSIS — I110 Hypertensive heart disease with heart failure: Secondary | ICD-10-CM | POA: Diagnosis not present

## 2023-10-01 DIAGNOSIS — I5020 Unspecified systolic (congestive) heart failure: Secondary | ICD-10-CM | POA: Diagnosis not present

## 2023-10-01 DIAGNOSIS — I69312 Visuospatial deficit and spatial neglect following cerebral infarction: Secondary | ICD-10-CM | POA: Diagnosis not present

## 2023-10-01 DIAGNOSIS — E1140 Type 2 diabetes mellitus with diabetic neuropathy, unspecified: Secondary | ICD-10-CM | POA: Diagnosis not present

## 2023-10-08 DIAGNOSIS — N390 Urinary tract infection, site not specified: Secondary | ICD-10-CM | POA: Diagnosis not present

## 2023-10-08 DIAGNOSIS — I5020 Unspecified systolic (congestive) heart failure: Secondary | ICD-10-CM | POA: Diagnosis not present

## 2023-10-08 DIAGNOSIS — I110 Hypertensive heart disease with heart failure: Secondary | ICD-10-CM | POA: Diagnosis not present

## 2023-10-08 DIAGNOSIS — E1140 Type 2 diabetes mellitus with diabetic neuropathy, unspecified: Secondary | ICD-10-CM | POA: Diagnosis not present

## 2023-10-08 DIAGNOSIS — H53462 Homonymous bilateral field defects, left side: Secondary | ICD-10-CM | POA: Diagnosis not present

## 2023-10-08 DIAGNOSIS — I69312 Visuospatial deficit and spatial neglect following cerebral infarction: Secondary | ICD-10-CM | POA: Diagnosis not present

## 2023-10-09 DIAGNOSIS — N390 Urinary tract infection, site not specified: Secondary | ICD-10-CM | POA: Diagnosis not present

## 2023-10-09 DIAGNOSIS — I5020 Unspecified systolic (congestive) heart failure: Secondary | ICD-10-CM | POA: Diagnosis not present

## 2023-10-09 DIAGNOSIS — H53462 Homonymous bilateral field defects, left side: Secondary | ICD-10-CM | POA: Diagnosis not present

## 2023-10-09 DIAGNOSIS — I110 Hypertensive heart disease with heart failure: Secondary | ICD-10-CM | POA: Diagnosis not present

## 2023-10-09 DIAGNOSIS — E1140 Type 2 diabetes mellitus with diabetic neuropathy, unspecified: Secondary | ICD-10-CM | POA: Diagnosis not present

## 2023-10-09 DIAGNOSIS — I69312 Visuospatial deficit and spatial neglect following cerebral infarction: Secondary | ICD-10-CM | POA: Diagnosis not present

## 2023-10-10 ENCOUNTER — Telehealth: Payer: Self-pay

## 2023-10-10 NOTE — Progress Notes (Signed)
   10/10/2023  Patient ID: [name], female   DOB: 02/23/1927, 89 y.o.   MRN: [id number]  Adherence Monitoring:  Up to date on meds, documentation in [facility].   Flint Hummer, PharmD

## 2023-10-14 DIAGNOSIS — H53462 Homonymous bilateral field defects, left side: Secondary | ICD-10-CM | POA: Diagnosis not present

## 2023-10-14 DIAGNOSIS — E1140 Type 2 diabetes mellitus with diabetic neuropathy, unspecified: Secondary | ICD-10-CM | POA: Diagnosis not present

## 2023-10-14 DIAGNOSIS — I5020 Unspecified systolic (congestive) heart failure: Secondary | ICD-10-CM | POA: Diagnosis not present

## 2023-10-14 DIAGNOSIS — I69312 Visuospatial deficit and spatial neglect following cerebral infarction: Secondary | ICD-10-CM | POA: Diagnosis not present

## 2023-10-14 DIAGNOSIS — I110 Hypertensive heart disease with heart failure: Secondary | ICD-10-CM | POA: Diagnosis not present

## 2023-10-14 DIAGNOSIS — N390 Urinary tract infection, site not specified: Secondary | ICD-10-CM | POA: Diagnosis not present

## 2023-10-21 DIAGNOSIS — H53462 Homonymous bilateral field defects, left side: Secondary | ICD-10-CM | POA: Diagnosis not present

## 2023-10-21 DIAGNOSIS — I69312 Visuospatial deficit and spatial neglect following cerebral infarction: Secondary | ICD-10-CM | POA: Diagnosis not present

## 2023-10-21 DIAGNOSIS — E1140 Type 2 diabetes mellitus with diabetic neuropathy, unspecified: Secondary | ICD-10-CM | POA: Diagnosis not present

## 2023-10-21 DIAGNOSIS — I110 Hypertensive heart disease with heart failure: Secondary | ICD-10-CM | POA: Diagnosis not present

## 2023-10-21 DIAGNOSIS — N390 Urinary tract infection, site not specified: Secondary | ICD-10-CM | POA: Diagnosis not present

## 2023-10-21 DIAGNOSIS — I5020 Unspecified systolic (congestive) heart failure: Secondary | ICD-10-CM | POA: Diagnosis not present

## 2023-10-27 DIAGNOSIS — M159 Polyosteoarthritis, unspecified: Secondary | ICD-10-CM | POA: Diagnosis not present

## 2023-10-27 DIAGNOSIS — M6281 Muscle weakness (generalized): Secondary | ICD-10-CM | POA: Diagnosis not present

## 2023-10-27 DIAGNOSIS — I110 Hypertensive heart disease with heart failure: Secondary | ICD-10-CM | POA: Diagnosis not present

## 2023-10-27 DIAGNOSIS — I5020 Unspecified systolic (congestive) heart failure: Secondary | ICD-10-CM | POA: Diagnosis not present

## 2023-10-27 DIAGNOSIS — I63531 Cerebral infarction due to unspecified occlusion or stenosis of right posterior cerebral artery: Secondary | ICD-10-CM | POA: Diagnosis not present

## 2023-10-27 DIAGNOSIS — E1140 Type 2 diabetes mellitus with diabetic neuropathy, unspecified: Secondary | ICD-10-CM | POA: Diagnosis not present

## 2023-10-31 DIAGNOSIS — J189 Pneumonia, unspecified organism: Secondary | ICD-10-CM | POA: Diagnosis not present

## 2023-10-31 DIAGNOSIS — R531 Weakness: Secondary | ICD-10-CM | POA: Diagnosis not present

## 2023-10-31 DIAGNOSIS — R410 Disorientation, unspecified: Secondary | ICD-10-CM | POA: Diagnosis not present

## 2023-10-31 DIAGNOSIS — R079 Chest pain, unspecified: Secondary | ICD-10-CM | POA: Diagnosis not present

## 2023-10-31 DIAGNOSIS — Z885 Allergy status to narcotic agent status: Secondary | ICD-10-CM | POA: Diagnosis not present

## 2023-10-31 DIAGNOSIS — N390 Urinary tract infection, site not specified: Secondary | ICD-10-CM | POA: Diagnosis not present

## 2023-10-31 DIAGNOSIS — Z743 Need for continuous supervision: Secondary | ICD-10-CM | POA: Diagnosis not present

## 2023-10-31 DIAGNOSIS — I10 Essential (primary) hypertension: Secondary | ICD-10-CM | POA: Diagnosis not present

## 2023-11-03 DIAGNOSIS — H53462 Homonymous bilateral field defects, left side: Secondary | ICD-10-CM | POA: Diagnosis not present

## 2023-11-03 DIAGNOSIS — I69312 Visuospatial deficit and spatial neglect following cerebral infarction: Secondary | ICD-10-CM | POA: Diagnosis not present

## 2023-11-03 DIAGNOSIS — I110 Hypertensive heart disease with heart failure: Secondary | ICD-10-CM | POA: Diagnosis not present

## 2023-11-03 DIAGNOSIS — N390 Urinary tract infection, site not specified: Secondary | ICD-10-CM | POA: Diagnosis not present

## 2023-11-05 DIAGNOSIS — I110 Hypertensive heart disease with heart failure: Secondary | ICD-10-CM | POA: Diagnosis not present

## 2023-11-05 DIAGNOSIS — N390 Urinary tract infection, site not specified: Secondary | ICD-10-CM | POA: Diagnosis not present

## 2023-11-05 DIAGNOSIS — I69312 Visuospatial deficit and spatial neglect following cerebral infarction: Secondary | ICD-10-CM | POA: Diagnosis not present

## 2023-11-05 DIAGNOSIS — H53462 Homonymous bilateral field defects, left side: Secondary | ICD-10-CM | POA: Diagnosis not present

## 2023-11-05 DIAGNOSIS — E1140 Type 2 diabetes mellitus with diabetic neuropathy, unspecified: Secondary | ICD-10-CM | POA: Diagnosis not present

## 2023-11-05 DIAGNOSIS — I5020 Unspecified systolic (congestive) heart failure: Secondary | ICD-10-CM | POA: Diagnosis not present

## 2023-11-07 DIAGNOSIS — I110 Hypertensive heart disease with heart failure: Secondary | ICD-10-CM | POA: Diagnosis not present

## 2023-11-07 DIAGNOSIS — I5020 Unspecified systolic (congestive) heart failure: Secondary | ICD-10-CM | POA: Diagnosis not present

## 2023-11-07 DIAGNOSIS — I69312 Visuospatial deficit and spatial neglect following cerebral infarction: Secondary | ICD-10-CM | POA: Diagnosis not present

## 2023-11-07 DIAGNOSIS — H53462 Homonymous bilateral field defects, left side: Secondary | ICD-10-CM | POA: Diagnosis not present

## 2023-11-07 DIAGNOSIS — E1140 Type 2 diabetes mellitus with diabetic neuropathy, unspecified: Secondary | ICD-10-CM | POA: Diagnosis not present

## 2023-11-07 DIAGNOSIS — N390 Urinary tract infection, site not specified: Secondary | ICD-10-CM | POA: Diagnosis not present

## 2023-11-12 ENCOUNTER — Telehealth: Payer: Self-pay

## 2023-11-12 DIAGNOSIS — I69312 Visuospatial deficit and spatial neglect following cerebral infarction: Secondary | ICD-10-CM | POA: Diagnosis not present

## 2023-11-12 DIAGNOSIS — N390 Urinary tract infection, site not specified: Secondary | ICD-10-CM | POA: Diagnosis not present

## 2023-11-12 DIAGNOSIS — E1140 Type 2 diabetes mellitus with diabetic neuropathy, unspecified: Secondary | ICD-10-CM | POA: Diagnosis not present

## 2023-11-12 DIAGNOSIS — I110 Hypertensive heart disease with heart failure: Secondary | ICD-10-CM | POA: Diagnosis not present

## 2023-11-12 DIAGNOSIS — H53462 Homonymous bilateral field defects, left side: Secondary | ICD-10-CM | POA: Diagnosis not present

## 2023-11-12 DIAGNOSIS — I5020 Unspecified systolic (congestive) heart failure: Secondary | ICD-10-CM | POA: Diagnosis not present

## 2023-11-12 NOTE — Telephone Encounter (Signed)
 Up to date on meds, next review at end of July

## 2023-11-14 DIAGNOSIS — N390 Urinary tract infection, site not specified: Secondary | ICD-10-CM | POA: Diagnosis not present

## 2023-11-14 DIAGNOSIS — I69312 Visuospatial deficit and spatial neglect following cerebral infarction: Secondary | ICD-10-CM | POA: Diagnosis not present

## 2023-11-14 DIAGNOSIS — I110 Hypertensive heart disease with heart failure: Secondary | ICD-10-CM | POA: Diagnosis not present

## 2023-11-14 DIAGNOSIS — I5020 Unspecified systolic (congestive) heart failure: Secondary | ICD-10-CM | POA: Diagnosis not present

## 2023-11-14 DIAGNOSIS — E1140 Type 2 diabetes mellitus with diabetic neuropathy, unspecified: Secondary | ICD-10-CM | POA: Diagnosis not present

## 2023-11-14 DIAGNOSIS — H53462 Homonymous bilateral field defects, left side: Secondary | ICD-10-CM | POA: Diagnosis not present

## 2023-11-20 DIAGNOSIS — H53462 Homonymous bilateral field defects, left side: Secondary | ICD-10-CM | POA: Diagnosis not present

## 2023-11-20 DIAGNOSIS — E1140 Type 2 diabetes mellitus with diabetic neuropathy, unspecified: Secondary | ICD-10-CM | POA: Diagnosis not present

## 2023-11-20 DIAGNOSIS — I5020 Unspecified systolic (congestive) heart failure: Secondary | ICD-10-CM | POA: Diagnosis not present

## 2023-11-20 DIAGNOSIS — N390 Urinary tract infection, site not specified: Secondary | ICD-10-CM | POA: Diagnosis not present

## 2023-11-20 DIAGNOSIS — I110 Hypertensive heart disease with heart failure: Secondary | ICD-10-CM | POA: Diagnosis not present

## 2023-11-20 DIAGNOSIS — I69312 Visuospatial deficit and spatial neglect following cerebral infarction: Secondary | ICD-10-CM | POA: Diagnosis not present

## 2023-11-22 DIAGNOSIS — I69312 Visuospatial deficit and spatial neglect following cerebral infarction: Secondary | ICD-10-CM | POA: Diagnosis not present

## 2023-11-22 DIAGNOSIS — N390 Urinary tract infection, site not specified: Secondary | ICD-10-CM | POA: Diagnosis not present

## 2023-11-22 DIAGNOSIS — I110 Hypertensive heart disease with heart failure: Secondary | ICD-10-CM | POA: Diagnosis not present

## 2023-11-22 DIAGNOSIS — H53462 Homonymous bilateral field defects, left side: Secondary | ICD-10-CM | POA: Diagnosis not present

## 2023-11-22 DIAGNOSIS — E1140 Type 2 diabetes mellitus with diabetic neuropathy, unspecified: Secondary | ICD-10-CM | POA: Diagnosis not present

## 2023-11-22 DIAGNOSIS — I5020 Unspecified systolic (congestive) heart failure: Secondary | ICD-10-CM | POA: Diagnosis not present

## 2023-11-26 DIAGNOSIS — M6281 Muscle weakness (generalized): Secondary | ICD-10-CM | POA: Diagnosis not present

## 2023-11-26 DIAGNOSIS — I110 Hypertensive heart disease with heart failure: Secondary | ICD-10-CM | POA: Diagnosis not present

## 2023-11-26 DIAGNOSIS — E1140 Type 2 diabetes mellitus with diabetic neuropathy, unspecified: Secondary | ICD-10-CM | POA: Diagnosis not present

## 2023-11-26 DIAGNOSIS — I5020 Unspecified systolic (congestive) heart failure: Secondary | ICD-10-CM | POA: Diagnosis not present

## 2023-11-26 DIAGNOSIS — I63531 Cerebral infarction due to unspecified occlusion or stenosis of right posterior cerebral artery: Secondary | ICD-10-CM | POA: Diagnosis not present

## 2023-11-26 DIAGNOSIS — M159 Polyosteoarthritis, unspecified: Secondary | ICD-10-CM | POA: Diagnosis not present

## 2023-12-10 ENCOUNTER — Telehealth: Payer: Self-pay

## 2023-12-10 NOTE — Telephone Encounter (Signed)
 Up to date on atorvastatin , next review in September

## 2024-01-13 DEATH — deceased
